# Patient Record
Sex: Female | Born: 1945 | Race: Black or African American | Hispanic: No | Marital: Married | Smoking: Never smoker
Health system: Southern US, Community
[De-identification: ages and names within clinical notes are randomized; demographics above are authoritative.]

## PROBLEM LIST (undated history)

## (undated) DIAGNOSIS — I10 Essential (primary) hypertension: Secondary | ICD-10-CM

## (undated) DIAGNOSIS — T4145XA Adverse effect of unspecified anesthetic, initial encounter: Secondary | ICD-10-CM

## (undated) DIAGNOSIS — R112 Nausea with vomiting, unspecified: Secondary | ICD-10-CM

## (undated) DIAGNOSIS — E785 Hyperlipidemia, unspecified: Secondary | ICD-10-CM

## (undated) DIAGNOSIS — M1990 Unspecified osteoarthritis, unspecified site: Secondary | ICD-10-CM

## (undated) DIAGNOSIS — T8859XA Other complications of anesthesia, initial encounter: Secondary | ICD-10-CM

## (undated) DIAGNOSIS — E119 Type 2 diabetes mellitus without complications: Secondary | ICD-10-CM

## (undated) DIAGNOSIS — Z9889 Other specified postprocedural states: Secondary | ICD-10-CM

## (undated) DIAGNOSIS — Z87442 Personal history of urinary calculi: Secondary | ICD-10-CM

## (undated) DIAGNOSIS — K219 Gastro-esophageal reflux disease without esophagitis: Secondary | ICD-10-CM

## (undated) DIAGNOSIS — E669 Obesity, unspecified: Secondary | ICD-10-CM

## (undated) HISTORY — PX: EYE SURGERY: SHX253

## (undated) HISTORY — PX: HEMORRHOID SURGERY: SHX153

## (undated) HISTORY — PX: ABDOMINAL HYSTERECTOMY: SHX81

## (undated) HISTORY — PX: COLONOSCOPY: SHX174

---

## 2004-03-12 ENCOUNTER — Ambulatory Visit: Payer: Self-pay | Admitting: Internal Medicine

## 2004-10-07 ENCOUNTER — Ambulatory Visit: Payer: Self-pay | Admitting: Unknown Physician Specialty

## 2004-11-30 ENCOUNTER — Other Ambulatory Visit: Payer: Self-pay

## 2004-11-30 ENCOUNTER — Emergency Department: Payer: Self-pay | Admitting: Unknown Physician Specialty

## 2005-03-27 ENCOUNTER — Ambulatory Visit: Payer: Self-pay | Admitting: Internal Medicine

## 2005-05-06 ENCOUNTER — Emergency Department: Payer: Self-pay | Admitting: Emergency Medicine

## 2005-05-13 ENCOUNTER — Ambulatory Visit: Payer: Self-pay | Admitting: Internal Medicine

## 2006-03-31 ENCOUNTER — Ambulatory Visit: Payer: Self-pay | Admitting: Internal Medicine

## 2006-08-20 ENCOUNTER — Ambulatory Visit: Payer: Self-pay

## 2007-05-12 ENCOUNTER — Ambulatory Visit: Payer: Self-pay | Admitting: Internal Medicine

## 2008-05-15 ENCOUNTER — Ambulatory Visit: Payer: Self-pay | Admitting: Internal Medicine

## 2009-05-24 ENCOUNTER — Ambulatory Visit: Payer: Self-pay | Admitting: Internal Medicine

## 2009-11-23 ENCOUNTER — Emergency Department: Payer: Self-pay | Admitting: Emergency Medicine

## 2009-12-19 ENCOUNTER — Ambulatory Visit: Payer: Self-pay | Admitting: Physician Assistant

## 2009-12-19 ENCOUNTER — Emergency Department: Payer: Self-pay | Admitting: Emergency Medicine

## 2009-12-22 ENCOUNTER — Emergency Department: Payer: Self-pay | Admitting: Emergency Medicine

## 2010-02-09 ENCOUNTER — Emergency Department: Payer: Self-pay | Admitting: Unknown Physician Specialty

## 2010-05-31 ENCOUNTER — Ambulatory Visit: Payer: Self-pay | Admitting: Internal Medicine

## 2011-01-22 ENCOUNTER — Emergency Department: Payer: Self-pay | Admitting: Internal Medicine

## 2011-06-24 ENCOUNTER — Ambulatory Visit: Payer: Self-pay | Admitting: Internal Medicine

## 2011-07-27 ENCOUNTER — Emergency Department: Payer: Self-pay | Admitting: *Deleted

## 2011-07-27 LAB — CBC
HCT: 43.3 % (ref 35.0–47.0)
HGB: 14.1 g/dL (ref 12.0–16.0)
MCH: 28.8 pg (ref 26.0–34.0)
MCHC: 32.5 g/dL (ref 32.0–36.0)
MCV: 89 fL (ref 80–100)
Platelet: 244 10*3/uL (ref 150–440)
RBC: 4.89 10*6/uL (ref 3.80–5.20)
WBC: 7.7 10*3/uL (ref 3.6–11.0)

## 2011-07-27 LAB — URINALYSIS, COMPLETE
Bilirubin,UR: NEGATIVE
Blood: NEGATIVE
Glucose,UR: >=500 mg/dL (ref 0–75)
Ph: 6 (ref 4.5–8.0)
Protein: NEGATIVE
RBC,UR: 12 /HPF (ref 0–5)
Specific Gravity: 1.02 (ref 1.003–1.030)
Squamous Epithelial: 7

## 2011-07-27 LAB — COMPREHENSIVE METABOLIC PANEL
Anion Gap: 8 (ref 7–16)
BUN: 13 mg/dL (ref 7–18)
Bilirubin,Total: 0.5 mg/dL (ref 0.2–1.0)
Co2: 27 mmol/L (ref 21–32)
Creatinine: 0.93 mg/dL (ref 0.60–1.30)
EGFR (African American): >60
EGFR (Non-African Amer.): >60
Osmolality: 287 (ref 275–301)
SGPT (ALT): 26 U/L
Sodium: 139 mmol/L (ref 136–145)
Total Protein: 8 g/dL (ref 6.4–8.2)

## 2011-08-30 ENCOUNTER — Emergency Department: Payer: Self-pay | Admitting: Emergency Medicine

## 2011-08-30 LAB — CBC
MCH: 28.5 pg (ref 26.0–34.0)
MCHC: 33 g/dL (ref 32.0–36.0)
Platelet: 242 10*3/uL (ref 150–440)
RBC: 4.62 10*6/uL (ref 3.80–5.20)
RDW: 13.7 % (ref 11.5–14.5)

## 2011-08-30 LAB — URINALYSIS, COMPLETE
Bacteria: NONE SEEN
Bilirubin,UR: NEGATIVE
Protein: NEGATIVE
RBC,UR: 64 /HPF (ref 0–5)
Specific Gravity: 1.023 (ref 1.003–1.030)
WBC UR: 20 /HPF (ref 0–5)

## 2012-07-09 ENCOUNTER — Ambulatory Visit: Payer: Self-pay | Admitting: Unknown Physician Specialty

## 2012-08-19 ENCOUNTER — Ambulatory Visit: Payer: Self-pay | Admitting: Internal Medicine

## 2013-08-22 ENCOUNTER — Ambulatory Visit: Payer: Self-pay | Admitting: Internal Medicine

## 2013-08-25 ENCOUNTER — Ambulatory Visit: Payer: Self-pay | Admitting: Internal Medicine

## 2014-02-28 ENCOUNTER — Ambulatory Visit: Payer: Self-pay | Admitting: Internal Medicine

## 2014-09-12 ENCOUNTER — Ambulatory Visit
Admit: 2014-09-12 | Disposition: A | Discharge status: Home / Self Care | Payer: Self-pay | Attending: Internal Medicine | Admitting: Internal Medicine

## 2015-04-05 IMAGING — MG MM MAMMO DIAGNOSTIC UNILATERAL*R*
4 series · 4 of 4 positions shown · non-contrast
Comparison: Multiple prior studies most recently 08/22/2013 and
08/25/2013

CLINICAL DATA: Six-month re-evaluation of right breast
calcifications

EXAM:
DIGITAL DIAGNOSTIC  RIGHT MAMMOGRAM WITH CAD

[R MLO]
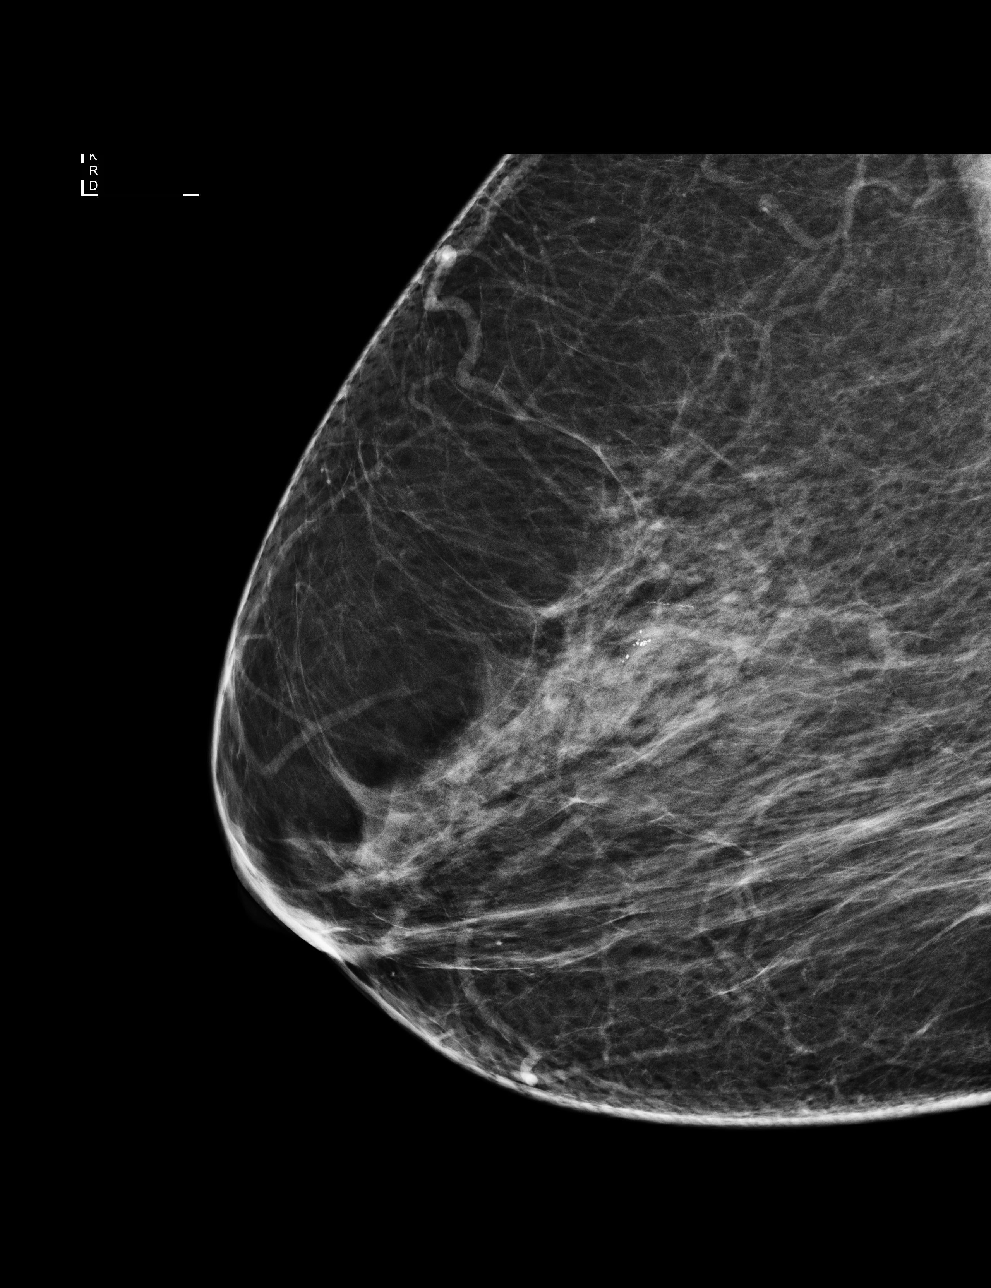

[R CC (1 of 2)]
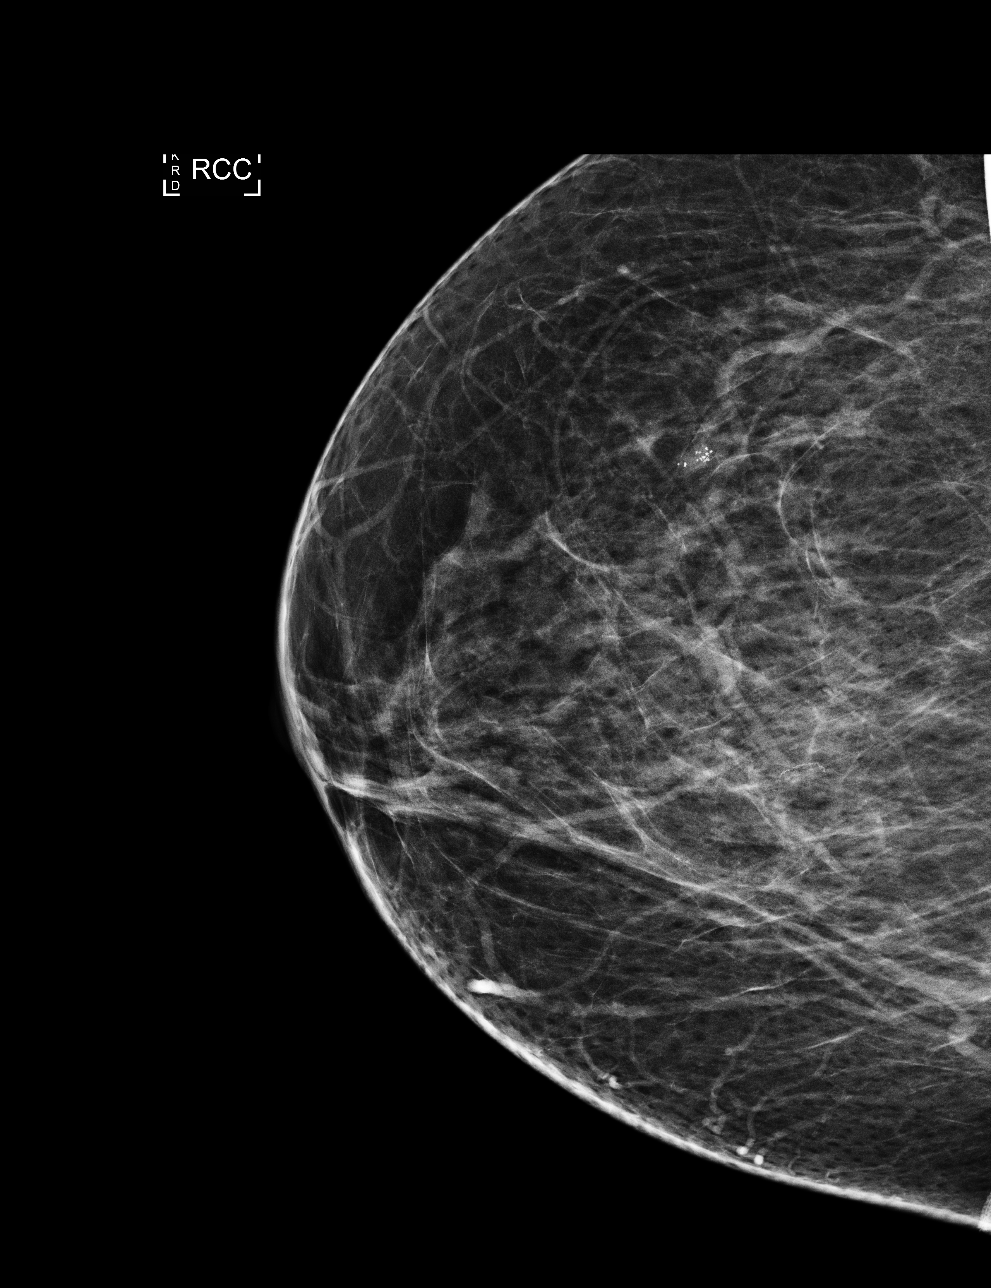

[R ML]
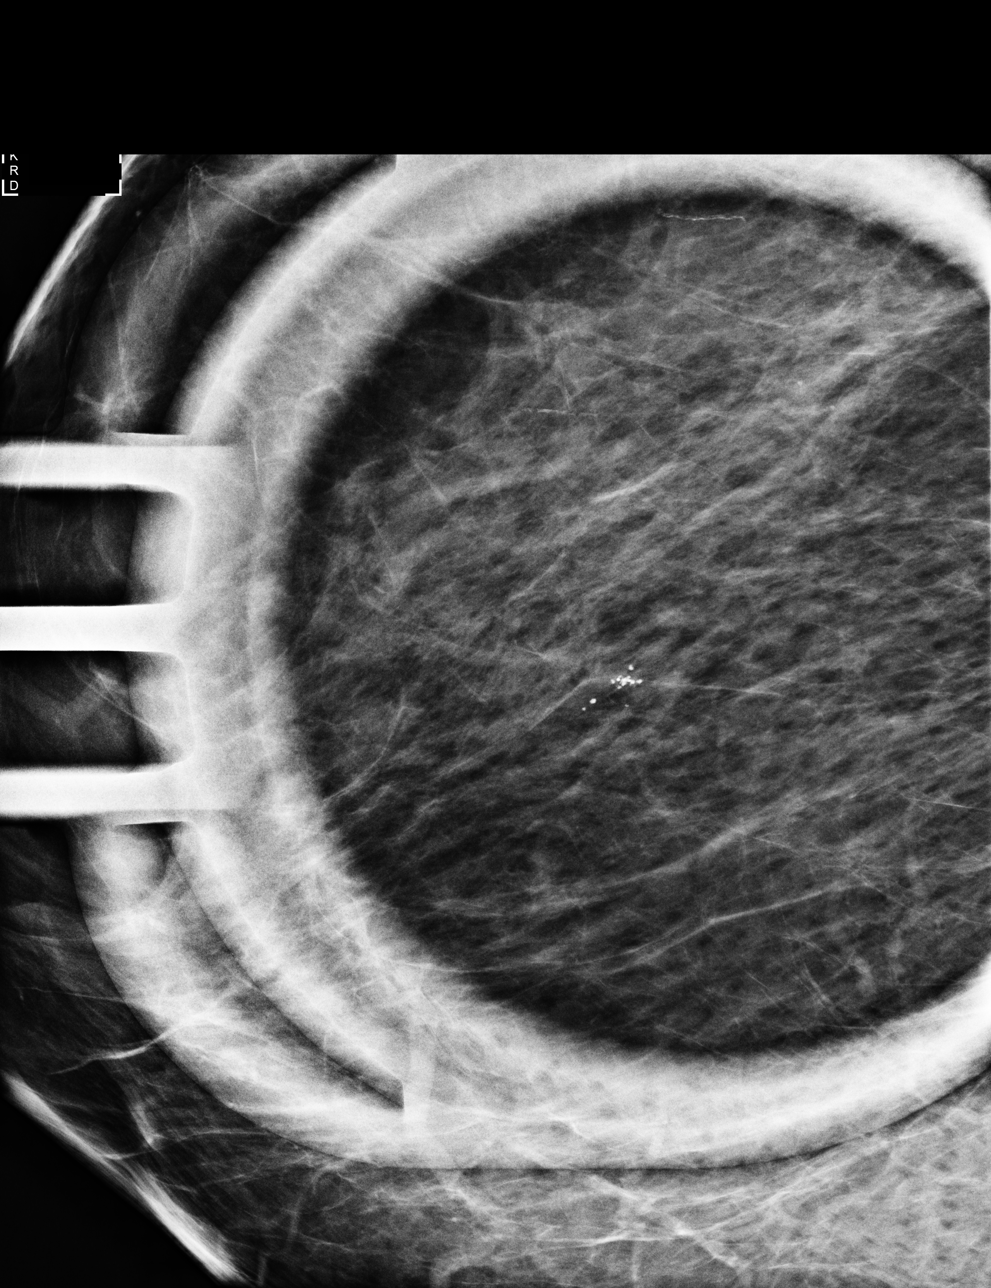

[R CC (2 of 2)]
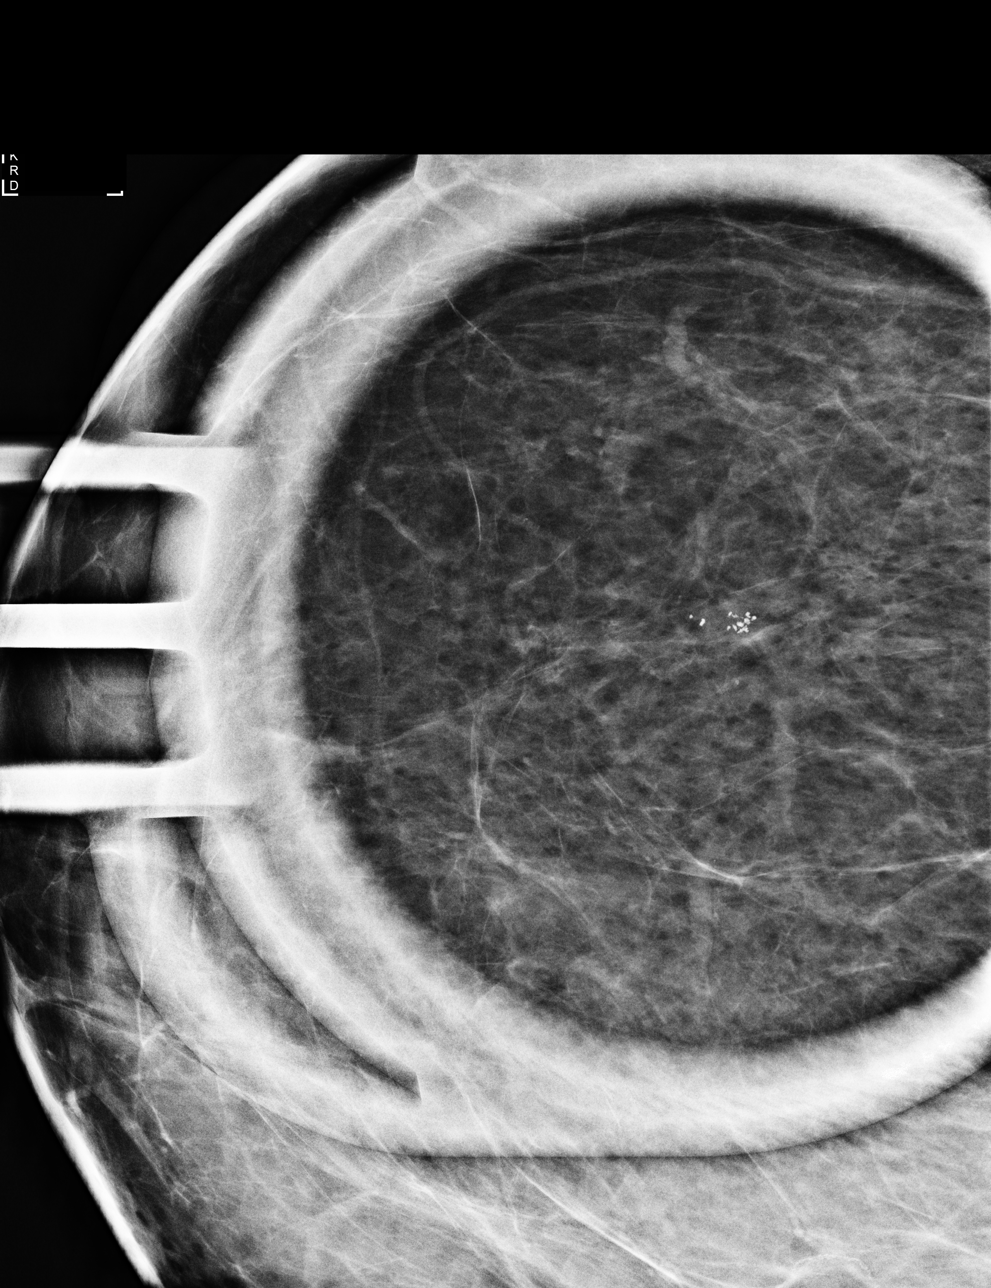

[4 of 4 positions shown; findings below may reference images not displayed]

ACR Breast Density Category c: The breast tissue is heterogeneously
dense, which may obscure small masses.
FINDINGS: Stable cluster of probably benign likely dystrophic calcifications
upper-outer quadrant right breast middle third depth. No other
significant findings or interval change on the right.

Mammographic images were processed with CAD.
IMPRESSION: Stable probably benign calcifications

RECOMMENDATION:
Diagnostic bilateral mammogram with magnified views on the right in
6 months

I have discussed the findings and recommendations with the patient.
Results were also provided in writing at the conclusion of the
visit. If applicable, a reminder letter will be sent to the patient
regarding the next appointment.

BI-RADS CATEGORY  Save

## 2015-05-03 ENCOUNTER — Other Ambulatory Visit: Payer: Self-pay | Admitting: Internal Medicine

## 2015-05-03 DIAGNOSIS — M1711 Unilateral primary osteoarthritis, right knee: Secondary | ICD-10-CM

## 2015-05-24 ENCOUNTER — Ambulatory Visit: Payer: Self-pay

## 2015-05-25 ENCOUNTER — Ambulatory Visit
Admission: RE | Admit: 2015-05-25 | Discharge: 2015-05-25 | Disposition: A | Discharge status: Home / Self Care | Payer: Medicare Other | Source: Ambulatory Visit | Attending: Internal Medicine | Admitting: Internal Medicine

## 2015-05-25 DIAGNOSIS — M25861 Other specified joint disorders, right knee: Secondary | ICD-10-CM | POA: Insufficient documentation

## 2015-05-25 DIAGNOSIS — M1711 Unilateral primary osteoarthritis, right knee: Secondary | ICD-10-CM | POA: Insufficient documentation

## 2015-05-25 DIAGNOSIS — X58XXXA Exposure to other specified factors, initial encounter: Secondary | ICD-10-CM | POA: Diagnosis not present

## 2015-05-25 DIAGNOSIS — R938 Abnormal findings on diagnostic imaging of other specified body structures: Secondary | ICD-10-CM | POA: Insufficient documentation

## 2015-05-25 DIAGNOSIS — S83231A Complex tear of medial meniscus, current injury, right knee, initial encounter: Secondary | ICD-10-CM | POA: Insufficient documentation

## 2015-05-25 DIAGNOSIS — S83281A Other tear of lateral meniscus, current injury, right knee, initial encounter: Secondary | ICD-10-CM | POA: Diagnosis not present

## 2015-06-14 ENCOUNTER — Inpatient Hospital Stay: Admission: RE | Admit: 2015-06-14 | Payer: Medicare Other | Source: Ambulatory Visit

## 2015-06-14 ENCOUNTER — Encounter: Payer: Self-pay | Admitting: *Deleted

## 2015-06-14 NOTE — Patient Instructions (Signed)
  Your procedure is scheduled on: 06/25/15 Report to Day Surgery.[facility] SECOND FLOOR To find out your arrival time please call [phone] between 1PM - 3PM on 06/22/15  Remember: Instructions that are not followed completely may result in serious medical risk, up to and including death, or upon the discretion of your surgeon and anesthesiologist your surgery may need to be rescheduled.    _X___ 1. Do not eat food or drink liquids after midnight. No gum chewing or hard candies.     __X__ 2. No Alcohol for 24 hours before or after surgery.   ____ 3. Bring all medications with you on the day of surgery if instructed.    _X___ 4. Notify your doctor if there is any change in your medical condition     (cold, fever, infections).     Do not wear jewelry, make-up, hairpins, clips or nail polish.  Do not wear lotions, powders, or perfumes. You may wear deodorant.  Do not shave 48 hours prior to surgery. Men may shave face and neck.  Do not bring valuables to the hospital.    [facility] is not responsible for any belongings or valuables.               Contacts, dentures or bridgework may not be worn into surgery.  Leave your suitcase in the car. After surgery it may be brought to your room.  For patients admitted to the hospital, discharge time is determined by your                treatment team.   Patients discharged the day of surgery will not be allowed to drive home.   Please read over the following fact sheets that you were given:   Surgical Site Infection Prevention   ____ Take these medicines the morning of surgery with A SIP OF WATER:    1. NONE  2.   3.   4.  5.  6.  ____ Fleet Enema (as directed)   __X__ Use CHG Soap as directed  ____ Use inhalers on the day of surgery  _X___ Stop metformin 2 days prior to surgery    ____ Take 1/2 of usual insulin dose the night before surgery and none on the morning of surgery.   _X___ Stop Coumadin/Plavix/aspirin on  STOP  ASPIRIN 1 WEEK BEFORE SURGERY  _X___ Stop Anti-inflammatories on    STOP ADVIL 1 WEEK BEFORE SURGERY    MAY USE TYLENOL IF NEEDED   ____ Stop supplements until after surgery.    ____ Bring C-Pap to the hospital.

## 2015-06-14 NOTE — Pre-Procedure Instructions (Addendum)
Component Name  04/26/2015 10/31/2014 04/19/2014 11/01/2013    4.9 5.4 5.5 6.2  4.81 4.89 4.67 4.90  14.0 14.7 14.1 14.7  43.4 44.3 42.6 43.7  90.2 90.6 91.2 89.2  29.1 30.1 30.2 30  32.3 33.2 33.1 33.6  12.6 13.3 12.8 13.1  9.2 9.8 9.8 9.7  0.00      WBC (White Blood Cell Count)  RBC (Red Blood Cell Count)  Hemoglobin  Hematocrit  MCV (Mean Corpuscular Volume)  MCH (Mean Corpuscular Hemoglobin)  MCHC (Mean Corpuscular Hemoglobin Concentration)  RDW-CV (Red Cell Distribution Width)  MPV (Mean Platelet Volume)  Immature Granulocyte Count   PROFILE  Component Name  04/26/2015 10/31/2014 04/19/2014 11/01/2013    132 (H) 84 130 (H) 110  139 140 138 138  4.1 4.2 4 4.1  105 105 104 102  26.9 31.5 29.7 32.1 (H)  9.4 9.7 9.4 9.'7  14 14 10 13  1 1 ' 0.9 0.9  66 66 75 75     14.4     8.0  6.8 6.9 6.6 6.7  3.7 3.8 3.6 3.8  0.6 0.6 0.7 0.6     0.1  53 60 56 55  '14 15 13 14  10 11 9 12  ' 1.2 1.2 1.2    Glucose  Sodium  Potassium  Chloride  Carbon Dioxide (CO2)  Calcium  Urea Nitrogen (BUN)  Creatinine  Glomerular Filtration Rate (eGFR), MDRD Estimate  BUN/Crea Ratio  Anion Gap w/K  Protein, Total  Albumin  Bilirubin, Total  Bilirubin, Conjugated  Alk Phos (alkaline Phosphatase)  AST   ALT   A/G Ratio

## 2015-06-18 ENCOUNTER — Encounter
Admission: RE | Admit: 2015-06-18 | Discharge: 2015-06-18 | Disposition: A | Discharge status: Home / Self Care | Payer: Medicare Other | Source: Ambulatory Visit | Attending: Orthopedic Surgery | Admitting: Orthopedic Surgery

## 2015-06-18 DIAGNOSIS — Z01810 Encounter for preprocedural cardiovascular examination: Secondary | ICD-10-CM | POA: Insufficient documentation

## 2015-06-25 ENCOUNTER — Encounter
Admission: RE | Disposition: A | Discharge status: Home / Self Care | Payer: Self-pay | Source: Ambulatory Visit | Attending: Orthopedic Surgery

## 2015-06-25 ENCOUNTER — Ambulatory Visit: Payer: Medicare Other | Admitting: Certified Registered"

## 2015-06-25 ENCOUNTER — Ambulatory Visit
Admission: RE | Admit: 2015-06-25 | Discharge: 2015-06-25 | Disposition: A | Discharge status: Home / Self Care | Payer: Medicare Other | Source: Ambulatory Visit | Attending: Orthopedic Surgery | Admitting: Orthopedic Surgery

## 2015-06-25 DIAGNOSIS — M23251 Derangement of posterior horn of lateral meniscus due to old tear or injury, right knee: Secondary | ICD-10-CM | POA: Insufficient documentation

## 2015-06-25 DIAGNOSIS — E119 Type 2 diabetes mellitus without complications: Secondary | ICD-10-CM | POA: Insufficient documentation

## 2015-06-25 DIAGNOSIS — Z7984 Long term (current) use of oral hypoglycemic drugs: Secondary | ICD-10-CM | POA: Diagnosis not present

## 2015-06-25 DIAGNOSIS — Z886 Allergy status to analgesic agent status: Secondary | ICD-10-CM | POA: Diagnosis not present

## 2015-06-25 DIAGNOSIS — K219 Gastro-esophageal reflux disease without esophagitis: Secondary | ICD-10-CM | POA: Diagnosis not present

## 2015-06-25 DIAGNOSIS — M25561 Pain in right knee: Secondary | ICD-10-CM | POA: Insufficient documentation

## 2015-06-25 DIAGNOSIS — Z7982 Long term (current) use of aspirin: Secondary | ICD-10-CM | POA: Insufficient documentation

## 2015-06-25 DIAGNOSIS — I10 Essential (primary) hypertension: Secondary | ICD-10-CM | POA: Insufficient documentation

## 2015-06-25 DIAGNOSIS — Z90710 Acquired absence of both cervix and uterus: Secondary | ICD-10-CM | POA: Diagnosis not present

## 2015-06-25 DIAGNOSIS — M2390 Unspecified internal derangement of unspecified knee: Secondary | ICD-10-CM | POA: Diagnosis present

## 2015-06-25 DIAGNOSIS — E785 Hyperlipidemia, unspecified: Secondary | ICD-10-CM | POA: Insufficient documentation

## 2015-06-25 DIAGNOSIS — M94261 Chondromalacia, right knee: Secondary | ICD-10-CM | POA: Diagnosis not present

## 2015-06-25 DIAGNOSIS — E669 Obesity, unspecified: Secondary | ICD-10-CM | POA: Diagnosis not present

## 2015-06-25 DIAGNOSIS — M1990 Unspecified osteoarthritis, unspecified site: Secondary | ICD-10-CM | POA: Insufficient documentation

## 2015-06-25 DIAGNOSIS — Z6831 Body mass index (BMI) 31.0-31.9, adult: Secondary | ICD-10-CM | POA: Diagnosis not present

## 2015-06-25 DIAGNOSIS — Z86010 Personal history of colonic polyps: Secondary | ICD-10-CM | POA: Insufficient documentation

## 2015-06-25 DIAGNOSIS — Z79899 Other long term (current) drug therapy: Secondary | ICD-10-CM | POA: Insufficient documentation

## 2015-06-25 DIAGNOSIS — M23231 Derangement of other medial meniscus due to old tear or injury, right knee: Secondary | ICD-10-CM | POA: Diagnosis not present

## 2015-06-25 DIAGNOSIS — Z833 Family history of diabetes mellitus: Secondary | ICD-10-CM | POA: Insufficient documentation

## 2015-06-25 HISTORY — PX: KNEE ARTHROSCOPY: SHX127

## 2015-06-25 HISTORY — DX: Hyperlipidemia, unspecified: E78.5

## 2015-06-25 HISTORY — DX: Essential (primary) hypertension: I10

## 2015-06-25 HISTORY — DX: Obesity, unspecified: E66.9

## 2015-06-25 HISTORY — DX: Type 2 diabetes mellitus without complications: E11.9

## 2015-06-25 HISTORY — DX: Gastro-esophageal reflux disease without esophagitis: K21.9

## 2015-06-25 HISTORY — DX: Unspecified osteoarthritis, unspecified site: M19.90

## 2015-06-25 LAB — GLUCOSE, CAPILLARY
Glucose-Capillary: 88 mg/dL (ref 65–99)
Glucose-Capillary: 78 mg/dL (ref 65–99)

## 2015-06-25 SURGERY — ARTHROSCOPY, KNEE
Anesthesia: General | Site: Knee | Laterality: Right | Wound class: Clean

## 2015-06-25 MED ORDER — ONDANSETRON HCL 4 MG PO TABS: 4.0000 mg | ORAL_TABLET | Freq: Four times a day (QID) | ORAL | Status: DC | PRN

## 2015-06-25 MED ORDER — ONDANSETRON HCL 4 MG PO TABS
ORAL_TABLET | ORAL | Status: AC
  Filled 2015-06-25: qty 1

## 2015-06-25 MED ORDER — SODIUM CHLORIDE 0.9 % IV SOLN
INTRAVENOUS | Status: DC
  Administered 2015-06-25: 15:00:00 via INTRAVENOUS

## 2015-06-25 MED ORDER — BUPIVACAINE HCL 0.25 % IJ SOLN
INTRAMUSCULAR | Status: DC | PRN
  Administered 2015-06-25: 5 mL
  Administered 2015-06-25: 25 mL

## 2015-06-25 MED ORDER — FENTANYL CITRATE (PF) 100 MCG/2ML IJ SOLN
INTRAMUSCULAR | Status: AC
  Filled 2015-06-25: qty 2

## 2015-06-25 MED ORDER — LABETALOL HCL 5 MG/ML IV SOLN
INTRAVENOUS | Status: AC
  Filled 2015-06-25: qty 4

## 2015-06-25 MED ORDER — ONDANSETRON HCL 4 MG/2ML IJ SOLN
INTRAMUSCULAR | Status: DC | PRN
  Administered 2015-06-25: 4 mg via INTRAVENOUS

## 2015-06-25 MED ORDER — GLYCOPYRROLATE 0.2 MG/ML IJ SOLN
INTRAMUSCULAR | Status: DC | PRN
  Administered 2015-06-25: 0.2 mg via INTRAVENOUS

## 2015-06-25 MED ORDER — METOCLOPRAMIDE HCL 10 MG PO TABS: 5.0000 mg | ORAL_TABLET | Freq: Three times a day (TID) | ORAL | Status: DC | PRN

## 2015-06-25 MED ORDER — LABETALOL HCL 5 MG/ML IV SOLN
5.0000 mg | Freq: Once | INTRAVENOUS | Status: AC
  Administered 2015-06-25: 5 mg via INTRAVENOUS

## 2015-06-25 MED ORDER — ACETAMINOPHEN 10 MG/ML IV SOLN
INTRAVENOUS | Status: DC | PRN
  Administered 2015-06-25: 1000 mg via INTRAVENOUS

## 2015-06-25 MED ORDER — MORPHINE SULFATE (PF) 4 MG/ML IV SOLN
INTRAVENOUS | Status: AC
  Filled 2015-06-25: qty 1

## 2015-06-25 MED ORDER — PROPOFOL 10 MG/ML IV BOLUS
INTRAVENOUS | Status: DC | PRN
  Administered 2015-06-25: 150 mg via INTRAVENOUS

## 2015-06-25 MED ORDER — PROMETHAZINE HCL 25 MG/ML IJ SOLN
INTRAMUSCULAR | Status: AC
  Administered 2015-06-25: 12.5 mg via INTRAMUSCULAR
  Filled 2015-06-25: qty 1

## 2015-06-25 MED ORDER — HYDROCODONE-ACETAMINOPHEN 5-325 MG PO TABS: 1.0000 | ORAL_TABLET | ORAL | Status: DC | PRN

## 2015-06-25 MED ORDER — ONDANSETRON HCL 4 MG/2ML IJ SOLN: 4.0000 mg | Freq: Once | INTRAMUSCULAR | Status: DC | PRN

## 2015-06-25 MED ORDER — SODIUM CHLORIDE 0.9 % IV SOLN: INTRAVENOUS | Status: DC

## 2015-06-25 MED ORDER — FAMOTIDINE 20 MG PO TABS
ORAL_TABLET | ORAL | Status: AC
  Filled 2015-06-25: qty 1

## 2015-06-25 MED ORDER — LIDOCAINE HCL (PF) 2 % IJ SOLN
INTRAMUSCULAR | Status: DC | PRN
  Administered 2015-06-25: 50 mg

## 2015-06-25 MED ORDER — METOCLOPRAMIDE HCL 5 MG/ML IJ SOLN: 5.0000 mg | Freq: Three times a day (TID) | INTRAMUSCULAR | Status: DC | PRN

## 2015-06-25 MED ORDER — FENTANYL CITRATE (PF) 100 MCG/2ML IJ SOLN
25.0000 ug | INTRAMUSCULAR | Status: DC | PRN
  Administered 2015-06-25 (×4): 25 ug via INTRAVENOUS

## 2015-06-25 MED ORDER — KETAMINE HCL 10 MG/ML IJ SOLN
INTRAMUSCULAR | Status: DC | PRN
  Administered 2015-06-25: 20 mg via INTRAVENOUS

## 2015-06-25 MED ORDER — ONDANSETRON HCL 4 MG/2ML IJ SOLN: 4.0000 mg | Freq: Four times a day (QID) | INTRAMUSCULAR | Status: DC | PRN

## 2015-06-25 MED ORDER — MORPHINE SULFATE (PF) 4 MG/ML IV SOLN
INTRAVENOUS | Status: DC | PRN
  Administered 2015-06-25: 4 mg

## 2015-06-25 MED ORDER — FAMOTIDINE 20 MG PO TABS
20.0000 mg | ORAL_TABLET | Freq: Once | ORAL | Status: AC
  Administered 2015-06-25: 20 mg via ORAL

## 2015-06-25 MED ORDER — PROMETHAZINE HCL 25 MG/ML IJ SOLN
12.5000 mg | Freq: Once | INTRAMUSCULAR | Status: AC
Start: 2015-06-25 — End: 2015-06-25
  Administered 2015-06-25: 12.5 mg via INTRAMUSCULAR

## 2015-06-25 MED ORDER — ACETAMINOPHEN 10 MG/ML IV SOLN
INTRAVENOUS | Status: AC
  Filled 2015-06-25: qty 100

## 2015-06-25 MED ORDER — BUPIVACAINE-EPINEPHRINE (PF) 0.25% -1:200000 IJ SOLN
INTRAMUSCULAR | Status: AC
  Filled 2015-06-25: qty 30

## 2015-06-25 MED ORDER — FENTANYL CITRATE (PF) 100 MCG/2ML IJ SOLN
INTRAMUSCULAR | Status: DC | PRN
  Administered 2015-06-25 (×4): 50 ug via INTRAVENOUS

## 2015-06-25 SURGICAL SUPPLY — 23 items
BLADE SHAVER 4.5 DBL SERAT CV (CUTTER) ×3 IMPLANT
BNDG ESMARK 6X12 TAN STRL LF (GAUZE/BANDAGES/DRESSINGS) ×3 IMPLANT
DRSG DERMACEA 8X12 NADH (GAUZE/BANDAGES/DRESSINGS) ×3 IMPLANT
DURAPREP 26ML APPLICATOR (WOUND CARE) ×6 IMPLANT
GAUZE SPONGE 4X4 12PLY STRL (GAUZE/BANDAGES/DRESSINGS) ×3 IMPLANT
GLOVE BIOGEL M STRL SZ7.5 (GLOVE) ×3 IMPLANT
GLOVE INDICATOR 8.0 STRL GRN (GLOVE) ×3 IMPLANT
GOWN STRL REUS W/ TWL LRG LVL3 (GOWN DISPOSABLE) ×1 IMPLANT
GOWN STRL REUS W/ TWL LRG LVL4 (GOWN DISPOSABLE) ×1 IMPLANT
GOWN STRL REUS W/TWL LRG LVL3 (GOWN DISPOSABLE) ×2
GOWN STRL REUS W/TWL LRG LVL4 (GOWN DISPOSABLE) ×2
IV LACTATED RINGER IRRG 3000ML (IV SOLUTION) ×12
IV LR IRRIG 3000ML ARTHROMATIC (IV SOLUTION) ×6 IMPLANT
MANIFOLD NEPTUNE II (INSTRUMENTS) ×3 IMPLANT
PACK ARTHROSCOPY KNEE (MISCELLANEOUS) ×3 IMPLANT
SET TUBE SUCT SHAVER OUTFL 24K (TUBING) ×3 IMPLANT
SET TUBE TIP INTRA-ARTICULAR (MISCELLANEOUS) ×3 IMPLANT
STRAP SAFETY BODY (MISCELLANEOUS) ×3 IMPLANT
SUT ETHILON 3-0 FS-10 30 BLK (SUTURE) ×3
SUTURE EHLN 3-0 FS-10 30 BLK (SUTURE) ×1 IMPLANT
TUBING ARTHRO INFLOW-ONLY STRL (TUBING) ×3 IMPLANT
WAND HAND CNTRL MULTIVAC 50 (MISCELLANEOUS) ×3 IMPLANT
WRAP KNEE W/COLD PACKS 25.5X14 (SOFTGOODS) ×3 IMPLANT

## 2015-06-25 NOTE — OR Nursing (Signed)
Dr Ernest Pine reviewed ua from 04/25/2016

## 2015-06-25 NOTE — OR Nursing (Signed)
Pt decided she is ready for discharge.

## 2015-06-25 NOTE — OR Nursing (Signed)
Pt continues to be nauseated and has vomited twice- Dr Maisie Fus notified pt unable to take po zofran order received for Phenergan.

## 2015-06-25 NOTE — OR Nursing (Signed)
Dr Maisie Fus advised pt to make an appt with primary care doctor  To evaluate blood pressure.

## 2015-06-25 NOTE — Anesthesia Procedure Notes (Signed)
Procedure Name: LMA Insertion Performed by: Piedad Standiford Pre-anesthesia Checklist: Patient identified, Patient being monitored, Timeout performed, Emergency Drugs available and Suction available Patient Re-evaluated:Patient Re-evaluated prior to inductionOxygen Delivery Method: Circle system utilized Preoxygenation: Pre-oxygenation with 100% oxygen Intubation Type: IV induction Ventilation: Mask ventilation without difficulty LMA: LMA inserted LMA Size: 3.0 Tube type: Oral Number of attempts: 1 Placement Confirmation: positive ETCO2 and breath sounds checked- equal and bilateral Tube secured with: Tape Dental Injury: Teeth and Oropharynx as per pre-operative assessment      

## 2015-06-25 NOTE — Brief Op Note (Signed)
06/25/2015  5:30 PM  PATIENT:  Courtney [name]  70 y.o. 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, right knee  PROCEDURE:  Procedure(s): ARTHROSCOPY KNEE, PARTIAL MEDIAL MENISECTOMY, PARTIAL LATERAL MENISECTOMY, CHONDROPLASTY MEDIAL PATELLAR FEMORAL (Right)  SURGEON:  Surgeon(s) and Role:    * Donato Heinz, MD - Primary  ASSISTANTS: none   ANESTHESIA:   general  EBL:  Total I/O In: 600 [I.V.:600] Out: -   BLOOD ADMINISTERED:none  DRAINS: none   LOCAL MEDICATIONS USED:  MARCAINE     SPECIMEN:  No Specimen  DISPOSITION OF SPECIMEN:  N/A  COUNTS:  YES  TOURNIQUET:   not used  DICTATION: .Office manager  PLAN OF CARE: Discharge to home after PACU  PATIENT DISPOSITION:  PACU - hemodynamically stable.   Delay start of Pharmacological VTE agent (>24hrs) due to surgical blood loss or risk of bleeding: not applicable

## 2015-06-25 NOTE — Anesthesia Preprocedure Evaluation (Addendum)
Anesthesia Evaluation  Patient identified by MRN, date of birth, ID band Patient awake    Reviewed: Allergy & Precautions, NPO status , Patient's Chart, lab work & pertinent test results, reviewed documented beta blocker date and time   Airway Mallampati: II  TM Distance: >3 FB     Dental  (+) Chipped   Pulmonary           Cardiovascular hypertension,      Neuro/Psych    GI/Hepatic GERD  Controlled,  Endo/Other  diabetes, Type 2  Renal/GU      Musculoskeletal  (+) Arthritis ,   Abdominal   Peds  Hematology   Anesthesia Other Findings   Reproductive/Obstetrics                           Anesthesia Physical Anesthesia Plan  ASA: III  Anesthesia Plan: General   Post-op Pain Management:    Induction: Intravenous  Airway Management Planned: LMA  Additional Equipment:   Intra-op Plan:   Post-operative Plan:   Informed Consent: I have reviewed the patients History and Physical, chart, labs and discussed the procedure including the risks, benefits and alternatives for the proposed anesthesia with the patient or authorized representative who has indicated his/her understanding and acceptance.     Plan Discussed with: CRNA  Anesthesia Plan Comments:        Anesthesia Quick Evaluation

## 2015-06-25 NOTE — Op Note (Signed)
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   ASSISTANT: none  ANESTHESIA: general  ESTIMATED BLOOD LOSS: Minimal  FLUIDS REPLACED: 600 mL of crystalloid  TOURNIQUET TIME: Not used   DRAINS: none  IMPLANTS UTILIZED: None  INDICATIONS FOR SURGERY: Courtney [name] is a 70 y.o. year old female who has been seen for complaints of right knee pain. MRI demonstrated findings consistent with meniscal pathology. After discussion of the risks and benefits of surgical intervention, the patient expressed understanding of the risks benefits and agree with plans for right knee arthroscopy.   PROCEDURE IN DETAIL: The patient was brought into the operating room and, after adequate general anesthesia was achieved, a tourniquet was applied to the right thigh and the leg was placed in the leg holder. All bony prominences were well padded. The patient's right knee was cleaned and prepped with alcohol and Duraprep and draped in the usual sterile fashion. A "timeout" was performed as per usual protocol. The anticipated portal sites were injected with 0.25% Marcaine with epinephrine. An anterolateral incision was made and a cannula was inserted. A large effusion was evacuated and the knee was distended with fluid using the pump. The scope was advanced down the medial gutter into the medial compartment. Under visualization with the scope, an anteromedial portal was created and a hooked probe was inserted.  The medial meniscus was visualized and probed. There was a complex degenerative tear of the posterior [place] the medial meniscus. The tear was debrided using a combination of meniscal punches and the 4.5 mm incisor shaver. Final contouring was performed using the 50 ArthroCare wand. The remaining rim of meniscus was visualized and probed and felt to be stable. The articular cartilage was visualized. There were grade 3 changes of chondromalacia involving focal area of the medial femoral condyle. These areas were debrided and contoured using the ArthroCare wand.  The scope was then advanced into the intercondylar notch. The anterior cruciate ligament was visualized and probed and felt to be intact. The scope was removed from the lateral portal and reinserted via the anteromedial portal to better visualize the lateral compartment. The lateral meniscus was visualized and probed. There were several radial tears along the posterior horn of the lateral meniscus. The tears were debrided using a meniscal punches and the 4.5 mm incisor shaver. Final contouring was performed using the ArthroCare wand. The articular cartilage of the lateral compartment was visualized. The articular surface was in good condition. Finally, the scope was advanced so as to visualize the patellofemoral articulation. Good patellar tracking was appreciated. There were grade 2-3 changes of chondromalacia involving the patellofemoral articulation. This area was debrided using ArthroCare wand.  The knee was irrigated with copius amounts of fluid and suctioned dry. The anterolateral portal was re-approximated with #3-0 nylon. A combination of 0.25% Marcaine with epinephrine and 4 mg of Morphine were injected via the scope. The scope was removed and the anteromedial portal was re-approximated with #3-0 nylon. A sterile dressing was applied followed by  application of an ice wrap.  The patient tolerated the procedure well and was transported to the  PACU in stable condition.  James P. Angie Fava., M.D.

## 2015-06-25 NOTE — Transfer of Care (Signed)
Immediate Anesthesia Transfer of Care Note  Patient: Courtney [name]  Procedure(s) Performed: Procedure(s): ARTHROSCOPY KNEE, PARTIAL MEDIAL MENISECTOMY, PARTIAL LATERAL MENISECTOMY, CHONDROPLASTY MEDIAL PATELLAR FEMORAL (Right)  Patient Location: PACU  Anesthesia Type:General  Level of Consciousness: awake  Airway & Oxygen Therapy: Patient Spontanous Breathing and Patient connected to face mask oxygen  Post-op Assessment: Report given to RN  Post vital signs: Reviewed  Last Vitals:  Filed Vitals:   06/25/15 1730 06/25/15 1731  BP: 197/89 197/89  Pulse: 104 101  Temp: 36.4 C 36.4 C  Resp: 15 14    Complications: No apparent anesthesia complications

## 2015-06-25 NOTE — OR Nursing (Signed)
Instructed incentive spirometry.

## 2015-06-25 NOTE — H&P (Signed)
The patient has been re-examined, and the chart reviewed, and there have been no interval changes to the documented history and physical.    The risks, benefits, and alternatives have been discussed at length. The patient expressed understanding of the risks benefits and agreed with plans for surgical intervention.  James P. Hooten, Jr. M.D.    

## 2015-06-25 NOTE — Discharge Instructions (Signed)
°  Instructions after Knee Arthroscopy  ° ° Aarish Rockers P. Aland Chestnutt, Jr., M.D.    ° Dept. of Orthopaedics & Sports Medicine ° [facility] ° [address] ° [place], [place]  [address] ° ° Phone: [phone]   Fax: [phone] ° ° °DIET: °• Drink plenty of non-alcoholic fluids & begin a light diet. °• Resume your normal diet the day after surgery. ° °ACTIVITY:  °• You may use crutches or a walker with weight-bearing as tolerated, unless instructed otherwise. °• You may wean yourself off of the walker or crutches as tolerated.  °• Begin doing gentle exercises. Exercising will reduce the pain and swelling, increase motion, and prevent muscle weakness.   °• Avoid strenuous activities or athletics for a minimum of 4-6 weeks after arthroscopic surgery. °• Do not drive or operate any equipment until instructed. ° °WOUND CARE:  °• Place one to two pillows under the knee the first day or two when sitting or lying.  °• Continue to use the ice packs periodically to reduce pain and swelling. °• The small incisions in your knee are closed with nylon stitches. The stitches will be removed in the office. °• The bulky dressing may be removed on the second day after surgery. DO NOT TOUCH THE STITCHES. Put a Band-Aid over each stitch. Do NOT use any ointments or creams on the incisions.  °• You may bathe or shower after the stitches are removed at the first office visit following surgery. ° °MEDICATIONS: °• You may resume your regular medications. °• Please take the pain medication as prescribed. °• Do not take pain medication on an empty stomach. °• Do not drive or drink alcoholic beverages when taking pain medications. ° °CALL THE OFFICE FOR: °• Temperature above 101 degrees °• Excessive bleeding or drainage on the dressing. °• Excessive swelling, coldness, or paleness of the toes. °• Persistent nausea and vomiting. ° °FOLLOW-UP:  °• You should have an appointment to return to the office in 7-10 days after surgery.  °  °

## 2015-06-26 ENCOUNTER — Encounter: Payer: Self-pay | Admitting: Orthopedic Surgery

## 2015-06-26 NOTE — Anesthesia Postprocedure Evaluation (Signed)
Anesthesia Post Note  Patient: Courtney [name]  Procedure(s) Performed: Procedure(s) (LRB): ARTHROSCOPY KNEE, PARTIAL MEDIAL MENISECTOMY, PARTIAL LATERAL MENISECTOMY, CHONDROPLASTY MEDIAL PATELLAR FEMORAL (Right)  Patient location during evaluation: PACU Anesthesia Type: General Level of consciousness: awake and alert Pain management: pain level controlled Vital Signs Assessment: post-procedure vital signs reviewed and stable Respiratory status: spontaneous breathing, nonlabored ventilation, respiratory function stable and patient connected to nasal cannula oxygen Cardiovascular status: blood pressure returned to baseline and stable Postop Assessment: no signs of nausea or vomiting Anesthetic complications: no    Last Vitals:  Filed Vitals:   06/25/15 1918 06/25/15 1932  BP: 156/70 169/85  Pulse: 73   Temp:    Resp: 16     Last Pain:  Filed Vitals:   06/26/15 1313  PainSc: 6                  Niyati Heinke S

## 2016-05-19 HISTORY — PX: CATARACT EXTRACTION W/ INTRAOCULAR LENS IMPLANT: SHX1309

## 2017-02-12 ENCOUNTER — Other Ambulatory Visit: Payer: Self-pay | Admitting: Internal Medicine

## 2017-02-12 DIAGNOSIS — R921 Mammographic calcification found on diagnostic imaging of breast: Secondary | ICD-10-CM

## 2017-02-24 ENCOUNTER — Other Ambulatory Visit: Payer: Medicare Other

## 2017-03-26 ENCOUNTER — Ambulatory Visit
Admission: RE | Admit: 2017-03-26 | Discharge: 2017-03-26 | Disposition: A | Discharge status: Home / Self Care | Payer: Medicare Other | Source: Ambulatory Visit | Attending: Internal Medicine | Admitting: Internal Medicine

## 2017-03-26 DIAGNOSIS — R921 Mammographic calcification found on diagnostic imaging of breast: Secondary | ICD-10-CM

## 2018-01-08 ENCOUNTER — Emergency Department: Payer: Medicare Other

## 2018-01-08 ENCOUNTER — Emergency Department
Admission: EM | Admit: 2018-01-08 | Discharge: 2018-01-08 | Disposition: A | Discharge status: Home / Self Care | Payer: Medicare Other | Attending: Urology | Admitting: Urology

## 2018-01-08 ENCOUNTER — Emergency Department: Payer: Medicare Other | Admitting: Anesthesiology

## 2018-01-08 ENCOUNTER — Encounter
Admission: EM | Disposition: A | Discharge status: Home / Self Care | Payer: Self-pay | Source: Home / Self Care | Attending: Emergency Medicine

## 2018-01-08 ENCOUNTER — Telehealth: Payer: Self-pay | Admitting: Urology

## 2018-01-08 DIAGNOSIS — Z79899 Other long term (current) drug therapy: Secondary | ICD-10-CM | POA: Diagnosis not present

## 2018-01-08 DIAGNOSIS — K219 Gastro-esophageal reflux disease without esophagitis: Secondary | ICD-10-CM | POA: Diagnosis not present

## 2018-01-08 DIAGNOSIS — Z7982 Long term (current) use of aspirin: Secondary | ICD-10-CM | POA: Diagnosis not present

## 2018-01-08 DIAGNOSIS — N390 Urinary tract infection, site not specified: Secondary | ICD-10-CM | POA: Diagnosis present

## 2018-01-08 DIAGNOSIS — N132 Hydronephrosis with renal and ureteral calculous obstruction: Secondary | ICD-10-CM

## 2018-01-08 DIAGNOSIS — I10 Essential (primary) hypertension: Secondary | ICD-10-CM | POA: Insufficient documentation

## 2018-01-08 DIAGNOSIS — Z7984 Long term (current) use of oral hypoglycemic drugs: Secondary | ICD-10-CM | POA: Insufficient documentation

## 2018-01-08 DIAGNOSIS — M1990 Unspecified osteoarthritis, unspecified site: Secondary | ICD-10-CM | POA: Diagnosis not present

## 2018-01-08 DIAGNOSIS — Z6830 Body mass index (BMI) 30.0-30.9, adult: Secondary | ICD-10-CM | POA: Insufficient documentation

## 2018-01-08 DIAGNOSIS — R1031 Right lower quadrant pain: Secondary | ICD-10-CM

## 2018-01-08 DIAGNOSIS — E119 Type 2 diabetes mellitus without complications: Secondary | ICD-10-CM | POA: Insufficient documentation

## 2018-01-08 DIAGNOSIS — E669 Obesity, unspecified: Secondary | ICD-10-CM | POA: Diagnosis not present

## 2018-01-08 DIAGNOSIS — E785 Hyperlipidemia, unspecified: Secondary | ICD-10-CM | POA: Insufficient documentation

## 2018-01-08 HISTORY — PX: CYSTOSCOPY WITH STENT PLACEMENT: SHX5790

## 2018-01-08 LAB — URINALYSIS, COMPLETE (UACMP) WITH MICROSCOPIC
BILIRUBIN URINE: NEGATIVE
Glucose, UA: >=500 mg/dL — AB
Ketones, ur: NEGATIVE mg/dL
Nitrite: POSITIVE — AB
PH: 5 (ref 5.0–8.0)
Protein, ur: NEGATIVE mg/dL
Specific Gravity, Urine: 1.016 (ref 1.005–1.030)
WBC, UA: >50 WBC/hpf — ABNORMAL HIGH (ref 0–5)

## 2018-01-08 LAB — COMPREHENSIVE METABOLIC PANEL
ALT: 14 U/L (ref 0–44)
ANION GAP: 5 (ref 5–15)
AST: 19 U/L (ref 15–41)
Albumin: 3.7 g/dL (ref 3.5–5.0)
Alkaline Phosphatase: 78 U/L (ref 38–126)
BUN: 17 mg/dL (ref 8–23)
CALCIUM: 9.1 mg/dL (ref 8.9–10.3)
CHLORIDE: 107 mmol/L (ref 98–111)
CO2: 27 mmol/L (ref 22–32)
CREATININE: 1.04 mg/dL — AB (ref 0.44–1.00)
GFR, EST NON AFRICAN AMERICAN: 52 mL/min — AB (ref >60)
Glucose, Bld: 98 mg/dL (ref 70–99)
Potassium: 3.9 mmol/L (ref 3.5–5.1)
Sodium: 139 mmol/L (ref 135–145)
Total Bilirubin: 0.4 mg/dL (ref 0.3–1.2)
Total Protein: 7.4 g/dL (ref 6.5–8.1)

## 2018-01-08 LAB — CBC
HCT: 41.2 % (ref 35.0–47.0)
HEMOGLOBIN: 14 g/dL (ref 12.0–16.0)
MCH: 30.4 pg (ref 26.0–34.0)
MCHC: 33.9 g/dL (ref 32.0–36.0)
MCV: 89.5 fL (ref 80.0–100.0)
PLATELETS: 243 10*3/uL (ref 150–440)
RBC: 4.61 MIL/uL (ref 3.80–5.20)
RDW: 13.8 % (ref 11.5–14.5)
WBC: 6.7 10*3/uL (ref 3.6–11.0)

## 2018-01-08 LAB — GLUCOSE, CAPILLARY: Glucose-Capillary: 95 mg/dL (ref 70–99)

## 2018-01-08 LAB — LIPASE, BLOOD: Lipase: 149 U/L — ABNORMAL HIGH (ref 11–51)

## 2018-01-08 SURGERY — CYSTOSCOPY, WITH STENT INSERTION
Anesthesia: General | Laterality: Right

## 2018-01-08 MED ORDER — PHENYLEPHRINE HCL 10 MG/ML IJ SOLN
INTRAMUSCULAR | Status: DC | PRN
  Administered 2018-01-08 (×2): 100 ug via INTRAVENOUS

## 2018-01-08 MED ORDER — ONDANSETRON HCL 4 MG/2ML IJ SOLN: 4.0000 mg | Freq: Once | INTRAMUSCULAR | Status: DC | PRN

## 2018-01-08 MED ORDER — SULFAMETHOXAZOLE-TRIMETHOPRIM 800-160 MG PO TABS: 1.0000 | ORAL_TABLET | Freq: Two times a day (BID) | ORAL | 0 refills | Status: DC

## 2018-01-08 MED ORDER — IOPAMIDOL (ISOVUE-300) INJECTION 61%
100.0000 mL | Freq: Once | INTRAVENOUS | Status: AC | PRN
  Administered 2018-01-08: 100 mL via INTRAVENOUS

## 2018-01-08 MED ORDER — SUCCINYLCHOLINE CHLORIDE 20 MG/ML IJ SOLN
INTRAMUSCULAR | Status: DC | PRN
  Administered 2018-01-08: 100 mg via INTRAVENOUS

## 2018-01-08 MED ORDER — GLYCOPYRROLATE 0.2 MG/ML IJ SOLN
INTRAMUSCULAR | Status: DC | PRN
  Administered 2018-01-08: 0.2 mg via INTRAVENOUS

## 2018-01-08 MED ORDER — PHENAZOPYRIDINE HCL 200 MG PO TABS: 200.0000 mg | ORAL_TABLET | Freq: Three times a day (TID) | ORAL | 0 refills | Status: DC | PRN

## 2018-01-08 MED ORDER — ONDANSETRON HCL 4 MG/2ML IJ SOLN
4.0000 mg | Freq: Once | INTRAMUSCULAR | Status: AC
  Administered 2018-01-08: 4 mg via INTRAVENOUS
  Filled 2018-01-08: qty 2

## 2018-01-08 MED ORDER — MORPHINE SULFATE (PF) 2 MG/ML IV SOLN
2.0000 mg | Freq: Once | INTRAVENOUS | Status: AC
  Administered 2018-01-08: 2 mg via INTRAVENOUS
  Filled 2018-01-08: qty 1

## 2018-01-08 MED ORDER — FENTANYL CITRATE (PF) 100 MCG/2ML IJ SOLN
INTRAMUSCULAR | Status: DC | PRN
  Administered 2018-01-08 (×2): 50 ug via INTRAVENOUS

## 2018-01-08 MED ORDER — DEXAMETHASONE SODIUM PHOSPHATE 10 MG/ML IJ SOLN
INTRAMUSCULAR | Status: DC | PRN
  Administered 2018-01-08: 4 mg via INTRAVENOUS

## 2018-01-08 MED ORDER — FENTANYL CITRATE (PF) 100 MCG/2ML IJ SOLN
INTRAMUSCULAR | Status: AC
  Filled 2018-01-08: qty 2

## 2018-01-08 MED ORDER — TRAMADOL HCL 50 MG PO TABS: 50.0000 mg | ORAL_TABLET | Freq: Four times a day (QID) | ORAL | 0 refills | Status: DC | PRN

## 2018-01-08 MED ORDER — LIDOCAINE HCL (CARDIAC) PF 100 MG/5ML IV SOSY
PREFILLED_SYRINGE | INTRAVENOUS | Status: DC | PRN
  Administered 2018-01-08: 50 mg via INTRAVENOUS

## 2018-01-08 MED ORDER — SODIUM CHLORIDE 0.9 % IV BOLUS
1000.0000 mL | Freq: Once | INTRAVENOUS | Status: AC
  Administered 2018-01-08: 1000 mL via INTRAVENOUS

## 2018-01-08 MED ORDER — ONDANSETRON HCL 4 MG/2ML IJ SOLN
INTRAMUSCULAR | Status: DC | PRN
  Administered 2018-01-08: 4 mg via INTRAVENOUS

## 2018-01-08 MED ORDER — DEXAMETHASONE SODIUM PHOSPHATE 10 MG/ML IJ SOLN
INTRAMUSCULAR | Status: AC
  Filled 2018-01-08: qty 1

## 2018-01-08 MED ORDER — EPHEDRINE SULFATE 50 MG/ML IJ SOLN
INTRAMUSCULAR | Status: AC
  Filled 2018-01-08: qty 1

## 2018-01-08 MED ORDER — FENTANYL CITRATE (PF) 100 MCG/2ML IJ SOLN: 25.0000 ug | INTRAMUSCULAR | Status: DC | PRN

## 2018-01-08 MED ORDER — SUCCINYLCHOLINE CHLORIDE 20 MG/ML IJ SOLN
INTRAMUSCULAR | Status: AC
  Filled 2018-01-08: qty 1

## 2018-01-08 MED ORDER — EPHEDRINE SULFATE 50 MG/ML IJ SOLN
INTRAMUSCULAR | Status: DC | PRN
  Administered 2018-01-08: 10 mg via INTRAVENOUS

## 2018-01-08 MED ORDER — ONDANSETRON HCL 4 MG/2ML IJ SOLN
INTRAMUSCULAR | Status: AC
  Filled 2018-01-08: qty 2

## 2018-01-08 MED ORDER — IOTHALAMATE MEGLUMINE 43 % IV SOLN
INTRAVENOUS | Status: DC | PRN
  Administered 2018-01-08: 4 mL

## 2018-01-08 MED ORDER — SODIUM CHLORIDE 0.9 % IV SOLN
1.0000 g | Freq: Once | INTRAVENOUS | Status: AC
  Administered 2018-01-08: 1 g via INTRAVENOUS
  Filled 2018-01-08: qty 10

## 2018-01-08 MED ORDER — IOPAMIDOL (ISOVUE-300) INJECTION 61%
30.0000 mL | Freq: Once | INTRAVENOUS | Status: AC | PRN
  Administered 2018-01-08: 30 mL via ORAL

## 2018-01-08 MED ORDER — PROPOFOL 10 MG/ML IV BOLUS
INTRAVENOUS | Status: AC
  Filled 2018-01-08: qty 20

## 2018-01-08 MED ORDER — PROPOFOL 10 MG/ML IV BOLUS
INTRAVENOUS | Status: DC | PRN
  Administered 2018-01-08: 150 mg via INTRAVENOUS

## 2018-01-08 MED ORDER — LIDOCAINE HCL (PF) 2 % IJ SOLN
INTRAMUSCULAR | Status: AC
  Filled 2018-01-08: qty 10

## 2018-01-08 MED ORDER — SODIUM CHLORIDE 0.9 % IV SOLN
INTRAVENOUS | Status: DC | PRN
  Administered 2018-01-08: 06:00:00 via INTRAVENOUS

## 2018-01-08 MED ORDER — GLYCOPYRROLATE 0.2 MG/ML IJ SOLN
INTRAMUSCULAR | Status: AC
  Filled 2018-01-08: qty 1

## 2018-01-08 SURGICAL SUPPLY — 17 items
BAG DRAIN CYSTO-URO LG1000N (MISCELLANEOUS) ×3 IMPLANT
CATH URETL 5X70 OPEN END (CATHETERS) ×3 IMPLANT
GLOVE BIO SURGEON STRL SZ7.5 (GLOVE) ×9 IMPLANT
GOWN STRL REUS W/ TWL LRG LVL3 (GOWN DISPOSABLE) ×2 IMPLANT
GOWN STRL REUS W/TWL LRG LVL3 (GOWN DISPOSABLE) ×4
PACK CYSTO AR (MISCELLANEOUS) ×3 IMPLANT
SENSORWIRE 0.038 NOT ANGLED (WIRE) ×3
SET CYSTO W/LG BORE CLAMP LF (SET/KITS/TRAYS/PACK) ×3 IMPLANT
SOL .9 NS 3000ML IRR  AL (IV SOLUTION) ×2
SOL .9 NS 3000ML IRR UROMATIC (IV SOLUTION) ×1 IMPLANT
SOL PREP PVP 2OZ (MISCELLANEOUS) ×3
SOLUTION PREP PVP 2OZ (MISCELLANEOUS) ×1 IMPLANT
STENT URET 6FRX24 CONTOUR (STENTS) ×3 IMPLANT
STENT URET 6FRX26 CONTOUR (STENTS) IMPLANT
SURGILUBE 2OZ TUBE FLIPTOP (MISCELLANEOUS) ×3 IMPLANT
WATER STERILE IRR 1000ML POUR (IV SOLUTION) ×3 IMPLANT
WIRE SENSOR 0.038 NOT ANGLED (WIRE) ×1 IMPLANT

## 2018-01-08 NOTE — Anesthesia Procedure Notes (Signed)
Procedure Name: Intubation Performed by: Alvin Critchley, MD Pre-anesthesia Checklist: Patient identified, Patient being monitored, Timeout performed, Emergency Drugs available and Suction available Patient Re-evaluated:Patient Re-evaluated prior to induction Oxygen Delivery Method: Circle system utilized Preoxygenation: Pre-oxygenation with 100% oxygen Induction Type: IV induction Ventilation: Mask ventilation without difficulty Laryngoscope Size: Mac and 3 Grade View: Grade I Tube type: Oral Tube size: 7.0 mm Number of attempts: 1 Airway Equipment and Method: Stylet Placement Confirmation: ETT inserted through vocal cords under direct vision,  positive ETCO2 and breath sounds checked- equal and bilateral Secured at: 21 cm Tube secured with: Tape Dental Injury: Teeth and Oropharynx as per pre-operative assessment

## 2018-01-08 NOTE — H&P (Signed)
I have been asked to see the patient by Dr. Chiquita Loth, for evaluation and management of right obstructing and infected ureteral stone.  History of present illness: [name] is a 72 year old African-American female with a past medical history significant for diabetes for the past week is felt right-sided abdominal pain.  The patient was not having any lower urinary tract symptoms including frequency or urgency.  The patient does note that she has dysuria and bladder pain, especially at the end of her stream.  She denies any hematuria.  Ultimately, the pain progressed to the point where she opted to proceed to the emergency department.  In the emergency department she was found to be afebrile.  She was having some associated nausea and vomiting.  The patient has no history significant for kidney stones.  She has no history of recurrent urinary tract infections.  In the emergency department the patient underwent a CT scan to rule out appendicitis.  She was found to have a 1 cm stone at the right UVJ with gas in the right collecting system and renal pelvis.  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  I was consulted for further evaluation and management.  Review of systems: A 12 point comprehensive review of systems was obtained and is negative unless otherwise stated in the history of present illness.  Patient Active Problem List   Diagnosis Date Noted  . Internal derangement of knee 06/25/2015    No current facility-administered medications on file prior to encounter.    Current Outpatient Medications on File Prior to Encounter  Medication Sig Dispense Refill  . aspirin 81 MG tablet Take 81 mg by mouth daily.    . canagliflozin (INVOKANA) 300 MG TABS tablet Take 300 mg by mouth daily before breakfast.    . glipiZIDE (GLUCOTROL XL) 10 MG 24 hr tablet Take 10 mg by mouth daily.  2  . lovastatin (MEVACOR) 20 MG tablet Take 20 mg by mouth  daily at 6 PM.    . metFORMIN (GLUCOPHAGE) 1000 MG tablet Take 1,000 mg by mouth 2 (two) times daily with a meal.    . nystatin ointment (MYCOSTATIN) Apply 1 application topically 2 (two) times daily as needed (rash).   2  . HYDROcodone-acetaminophen (NORCO) 5-325 MG tablet Take 1-2 tablets by mouth every 4 (four) hours as needed for moderate pain. (Patient not taking: Reported on 01/08/2018) 60 tablet 0    Past Medical History:  Diagnosis Date  . Arthritis   . Diabetes mellitus without complication (HCC)   . GERD (gastroesophageal reflux disease)   . Hyperlipemia   . Hypertension   . Obesity     Past Surgical History:  Procedure Laterality Date  . ABDOMINAL HYSTERECTOMY    . COLONOSCOPY    . HEMORRHOID SURGERY    . KNEE ARTHROSCOPY Right 06/25/2015   Procedure: ARTHROSCOPY KNEE, PARTIAL MEDIAL MENISECTOMY, PARTIAL LATERAL MENISECTOMY, CHONDROPLASTY MEDIAL PATELLAR FEMORAL;  Surgeon: Donato Heinz, MD;  Location: [facility] ORS;  Service: Orthopedics;  Laterality: Right;    Social History   Tobacco Use  . Smoking status: Never Smoker  . Smokeless tobacco: Never Used  Substance Use Topics  . Alcohol use: No  . Drug use: No    Family History  Problem Relation Age of Onset  . Breast cancer Neg Hx     PE: Vitals:   01/08/18 0415 01/08/18 0424 01/08/18 0430 01/08/18 0517  BP:   Marland Kitchen)  169/129 (!) 177/66  Pulse: 81   81  Resp:    18  Temp:  97.8 F (36.6 C)  98.7 F (37.1 C)  TempSrc:  Oral  Oral  SpO2: 95%   96%  Weight:      Height:       Patient appears to be in moderate acute distress  patient is alert and oriented x3 Atraumatic normocephalic head No cervical or supraclavicular lymphadenopathy appreciated No increased work of breathing, no audible wheezes/rhonchi Regular sinus rhythm/rate Abdomen is soft, the patient has right-sided abdominal tenderness and right CVA tenderness. Lower extremities are symmetric without appreciable edema Grossly neurologically  intact No identifiable skin lesions  Recent Labs    01/08/18 0027  WBC 6.7  HGB 14.0  HCT 41.2   Recent Labs    01/08/18 0027  NA 139  K 3.9  CL 107  CO2 27  GLUCOSE 98  BUN 17  CREATININE 1.04*  CALCIUM 9.1   No results for input(s): LABPT, INR in the last 72 hours. No results for input(s): LABURIN in the last 72 hours. No results found for this or any previous visit.  Imaging: CT scan-abdomen/pelvis: I independently reviewed the images and discussed them with the patient which demonstrates a 1 cm stone at the right UVJ with mild right hydroureteronephrosis.  There is gas within the renal pelvis and the collecting system.  There is also gas surrounding the stone raising the question as to whether the patient has a right ureterocele.  Imp: The patient has an infected right ureteral stone with mild hydroureteronephrosis and a urine analysis that is concerning for infection.  She currently shows no signs of septic physiology, however her CT scan demonstrates gas within the right collecting system and the right UVJ appears abnormal, perhaps she has a ureterocele compounding her issues.  Recommendations: I spoke to the patient about her CT scan as well as the concern for urinary tract infection and the risk for impending urosepsis.  I recommended that we proceed to the operating room for stent placement.  This may also require incision of her ureterocele in order to get the stent into the right collecting system.  I described this procedure for the patient and quite a bit of detail.  She understands to expect postoperative discomfort as well as irritative voiding symptoms from the stent.  She also understands that she will need a second procedure in the coming weeks once her infection clears for definitive management of her stone.  Following the procedure we will evaluate her in the PACU, and at that time make a decision as to whether the patient needs to be admitted for observation or  whether she can be discharged home with antibiotics.   Berniece SalinesHERRICK, BENJAMIN W

## 2018-01-08 NOTE — Anesthesia Postprocedure Evaluation (Signed)
Anesthesia Post Note  Patient: Courtney CreekMargaret T Malay  Procedure(s) Performed: CYSTOSCOPY WITH STENT PLACEMENT (Right )  Patient location during evaluation: PACU Anesthesia Type: General Level of consciousness: awake and alert and oriented Pain management: pain level controlled Vital Signs Assessment: post-procedure vital signs reviewed and stable Respiratory status: spontaneous breathing Cardiovascular status: blood pressure returned to baseline Anesthetic complications: no     Last Vitals:  Vitals:   01/08/18 0747 01/08/18 0752  BP: 133/62 (!) 149/61  Pulse: 85 88  Resp: 16 16  Temp:  (!) 36.4 C  SpO2: 96% 99%    Last Pain:  Vitals:   01/08/18 0752  TempSrc: Temporal  PainSc: 0-No pain                 Lister Brizzi

## 2018-01-08 NOTE — ED Provider Notes (Signed)
[facility] Emergency Department Provider Note   ____________________________________________   First MD Initiated Contact with Patient 01/08/18 [phone]     (approximate)  I have reviewed the triage vital signs and the nursing notes.   HISTORY  Chief Complaint Abdominal Pain    HPI Courtney [name] is a 72 y.o. female who presents to the ED from home with a chief complaint of abdominal pain and frequent urination.  Symptoms started 4 to 5 days ago.  Patient noted dysuria and frequent urination.  Complains of right lower quadrant abdominal pain.  Denies associated fever, chills, chest pain, shortness of breath, nausea, vomiting, diarrhea.  Denies recent travel or trauma.   Past Medical History:  Diagnosis Date  . Arthritis   . Diabetes mellitus without complication (HCC)   . GERD (gastroesophageal reflux disease)   . Hyperlipemia   . Hypertension   . Obesity     Patient Active Problem List   Diagnosis Date Noted  . Internal derangement of knee 06/25/2015    Past Surgical History:  Procedure Laterality Date  . ABDOMINAL HYSTERECTOMY    . COLONOSCOPY    . HEMORRHOID SURGERY    . KNEE ARTHROSCOPY Right 06/25/2015   Procedure: ARTHROSCOPY KNEE, PARTIAL MEDIAL MENISECTOMY, PARTIAL LATERAL MENISECTOMY, CHONDROPLASTY MEDIAL PATELLAR FEMORAL;  Surgeon: Donato Heinz, MD;  Location: [facility] ORS;  Service: Orthopedics;  Laterality: Right;    Prior to Admission medications   Medication Sig Start Date End Date Taking? Authorizing Provider  aspirin 81 MG tablet Take 81 mg by mouth daily.    [provider]  canagliflozin (INVOKANA) 300 MG TABS tablet Take 300 mg by mouth daily before breakfast.    [provider]  glipiZIDE (GLUCOTROL XL) 10 MG 24 hr tablet Take 10 mg by mouth daily. 12/27/17   [provider]  HYDROcodone-acetaminophen (NORCO) 5-325 MG tablet Take 1-2 tablets by mouth every 4 (four) hours as needed for moderate  pain. 06/25/15   Hooten, Illene Labrador, MD  lovastatin (MEVACOR) 20 MG tablet Take 20 mg by mouth daily at 6 PM.    [provider]  meloxicam (MOBIC) 7.5 MG tablet Take 7.5 mg by mouth daily. 12/15/17   [provider]  metFORMIN (GLUCOPHAGE) 1000 MG tablet Take 1,000 mg by mouth 2 (two) times daily with a meal.    [provider]  nystatin ointment (MYCOSTATIN) Apply 1 application topically 2 (two) times daily. 11/26/17   [provider]    Allergies Ibuprofen  Family History  Problem Relation Age of Onset  . Breast cancer Neg Hx     Social History Social History   Tobacco Use  . Smoking status: Never Smoker  . Smokeless tobacco: Never Used  Substance Use Topics  . Alcohol use: No  . Drug use: No    Review of Systems  Constitutional: No fever/chills Eyes: No visual changes. ENT: No sore throat. Cardiovascular: Denies chest pain. Respiratory: Denies shortness of breath. Gastrointestinal: Positive for right lower quadrant abdominal pain.  No nausea, no vomiting.  No diarrhea.  No constipation. Genitourinary: Positive for dysuria and frequency. Musculoskeletal: Negative for back pain. Skin: Negative for rash. Neurological: Negative for headaches, focal weakness or numbness.   ____________________________________________   PHYSICAL EXAM:  VITAL SIGNS: ED Triage Vitals  Enc Vitals Group     BP 01/08/18 0024 (!) 174/81     Pulse Rate 01/08/18 0024 80     Resp 01/08/18 0024 17  Temp 01/08/18 0024 98.3 F (36.8 C)     Temp Source 01/08/18 0024 Oral     SpO2 01/08/18 0024 99 %     Weight 01/08/18 0025 185 lb (83.9 kg)     Height 01/08/18 0025 5\' 5"  (1.651 m)     Head Circumference --      Peak Flow --      Pain Score 01/08/18 0025 8     Pain Loc --      Pain Edu? --      Excl. in GC? --     Constitutional: Alert and oriented. Well appearing and in no acute distress. Eyes: Conjunctivae are normal. PERRL. EOMI. Head:  Atraumatic. Nose: No congestion/rhinnorhea. Mouth/Throat: Mucous membranes are moist.  Oropharynx non-erythematous. Neck: No stridor.   Cardiovascular: Normal rate, regular rhythm. Grossly normal heart sounds.  Good peripheral circulation. Respiratory: Normal respiratory effort.  No retractions. Lungs CTAB. Gastrointestinal: Soft and mildly tender to palpation right lower quadrant without rebound or guarding. No distention. No abdominal bruits. No CVA tenderness. Musculoskeletal: No lower extremity tenderness nor edema.  No joint effusions. Neurologic:  Normal speech and language. No gross focal neurologic deficits are appreciated. No gait instability. Skin:  Skin is warm, dry and intact. No rash noted. Psychiatric: Mood and affect are normal. Speech and behavior are normal.  ____________________________________________   LABS (all labs ordered are listed, but only abnormal results are displayed)  Labs Reviewed  LIPASE, BLOOD - Abnormal; Notable for the following components:      Result Value   Lipase 149 (*)    All other components within normal limits  COMPREHENSIVE METABOLIC PANEL - Abnormal; Notable for the following components:   Creatinine, Ser 1.04 (*)    GFR calc non Af Amer 52 (*)    All other components within normal limits  URINALYSIS, COMPLETE (UACMP) WITH MICROSCOPIC - Abnormal; Notable for the following components:   Color, Urine YELLOW (*)    APPearance CLOUDY (*)    Glucose, UA >=500 (*)    Hgb urine dipstick LARGE (*)    Nitrite POSITIVE (*)    Leukocytes, UA LARGE (*)    RBC / HPF >50 (*)    WBC, UA >50 (*)    Bacteria, UA RARE (*)    All other components within normal limits  URINE CULTURE  CBC   ____________________________________________  EKG  ED ECG REPORT I, Tyreak Reagle J, the attending physician, personally viewed and interpreted this ECG.   Date: 01/08/2018  EKG Time: 0517  Rate: 82  Rhythm: normal EKG, normal sinus rhythm  Axis: Normal   Intervals:none  ST&T Change: Nonspecific  ____________________________________________  RADIOLOGY  ED MD interpretation: 1 cm right UVJ stone with gas-forming infection  Official radiology report(s): Ct Abdomen Pelvis W Contrast  Result Date: 01/08/2018 CLINICAL DATA:  Lower quadrant abdominal pain with frequent urination EXAM: CT ABDOMEN AND PELVIS WITH CONTRAST TECHNIQUE: Multidetector CT imaging of the abdomen and pelvis was performed using the standard protocol following bolus administration of intravenous contrast. CONTRAST:  100mL ISOVUE-300 IOPAMIDOL (ISOVUE-300) INJECTION 61% COMPARISON:  None. FINDINGS: Lower chest: Lung bases demonstrate no acute consolidation or pleural effusion. The heart size is within normal limits. Mild distal esophageal thickening with hiatal hernia. Hepatobiliary: No focal liver abnormality is seen. No gallstones, gallbladder wall thickening, or biliary dilatation. Pancreas: Unremarkable. No pancreatic ductal dilatation or surrounding inflammatory changes. Spleen: Normal in size without focal abnormality. Adrenals/Urinary Tract: Adrenal glands are within normal limits. Left kidney shows  no hydronephrosis. Small locules of gas within the right renal collecting system. Mild right hydronephrosis and hydroureter. Air within the dilated right ureter. 1 cm stone within the bladder. There is surrounding focal fluid and gas bubbles around the stone. Small amount of air within the bladder. Stomach/Bowel: Stomach is nonenlarged. No dilated small bowel. Sigmoid colon diverticular disease without acute inflammatory change. Vascular/Lymphatic: Moderate aortic atherosclerosis. No aneurysm. No significantly enlarged lymph nodes. Reproductive: Status post hysterectomy. No adnexal masses. Other: No free air or free fluid. Musculoskeletal: Degenerative changes of the spine. No acute or suspicious bony abnormality. IMPRESSION: 1. Mild right hydronephrosis and hydroureter. This is secondary  to a 1 cm stone in the region of the right UVJ. Unusual configuration at the right UVJ with fluid and gas surrounding the stone, on coronal views this appears to be contiguous with the dilated ureter and could reflect inverted ureterocele. Gas is present in the bladder and extends into the right ureter and right renal collecting system, consistent with gas-forming infection. 2. Sigmoid colon diverticular disease without acute inflammatory change. Electronically Signed   By: Jasmine Pang M.D.   On: 01/08/2018 03:34    ____________________________________________   PROCEDURES  Procedure(s) performed: None  Procedures  Critical Care performed:   CRITICAL CARE Performed by: Irean Hong   Total critical care time: 45 minutes  Critical care time was exclusive of separately billable procedures and treating other patients.  Critical care was necessary to treat or prevent imminent or life-threatening deterioration.  Critical care was time spent personally by me on the following activities: development of treatment plan with patient and/or surrogate as well as nursing, discussions with consultants, evaluation of patient's response to treatment, examination of patient, obtaining history from patient or surrogate, ordering and performing treatments and interventions, ordering and review of laboratory studies, ordering and review of radiographic studies, pulse oximetry and re-evaluation of patient's condition.  ____________________________________________   INITIAL IMPRESSION / ASSESSMENT AND PLAN / ED COURSE  As part of my medical decision making, I reviewed the following data within the electronic MEDICAL RECORD NUMBER Nursing notes reviewed and incorporated, Labs reviewed, Old chart reviewed, Radiograph reviewed  and Notes from prior ED visits   72 year old female who presents with a 4 to 5-day history of right lower quadrant abdominal pain, dysuria and urinary frequency. Differential diagnosis  includes, but is not limited to, ovarian cyst, ovarian torsion, acute appendicitis, diverticulitis, urinary tract infection/pyelonephritis, endometriosis, bowel obstruction, colitis, renal colic, gastroenteritis, hernia, fibroids, endometriosis, etc.  Laboratory urinalysis results remarkable for cystitis and elevated lipase.  Patient does not have upper abdominal tenderness to exam nor is she experiencing nausea or vomiting.  Most likely pain is secondary to cystitis; however, will proceed with CT abdomen/pelvis to evaluate for appendicitis.  Will initiate IV fluid resuscitation, 2 mg IV morphine for pain paired with 4 mg IV Zofran for nausea.  Will administer 1 g IV Rocephin for UTI.  Clinical Course as of Jan 08 450  Fri Jan 08, 2018  1610 CT results noted.  Will discuss with urology on-call.  Patient currently resting.   [JS]  [phone] Spoke with Dr. Marlou Porch who will evaluate patient in the ED for probable stent placement.  Patient and family member has been updated and agreeable with plan of care.  Last meal 5:30 PM.   [JS]    Clinical Course User Index [JS] Irean Hong, MD     ____________________________________________   FINAL CLINICAL IMPRESSION(S) / ED DIAGNOSES  Final diagnoses:  Lower urinary tract infectious disease  Right lower quadrant abdominal pain  Ureteral stone with hydronephrosis     ED Discharge Orders    None       Note:  This document was prepared using Dragon voice recognition software and may include unintentional dictation errors.    Irean Hong, MD 01/08/18 [phone]

## 2018-01-08 NOTE — Telephone Encounter (Signed)
App made and patient is aware  Courtney [name]

## 2018-01-08 NOTE — ED Notes (Signed)
ED Provider at bedside. 

## 2018-01-08 NOTE — Op Note (Signed)
Preoperative diagnosis: right obstructing ureteral stone Postoperative diagnosis: Same  Procedures performed: Cystoscopy, right retrograde pyelogram with interpretation, right ureteral stent placement  Surgeon: Dr. Crist FatBenjamin W. Symphony Demuro  Findings: Cysto demostrated a bulging right ureteral orifice, the stone was impacted within the intramural ureter, with no ureterocele as initially suspected based on her imaging.  I was able to get a wire across the stone with some gentle manipulation.  A retrograde pyelogram was performed, contrast was instilled into the right collecting system and there was a no obvious filling defect, there ureter was torturous, there was moderate hydronephrosis. There were no other abnormalities.  Specimens:none, unable to get urine to aspirate through open ended catheter.  Indication: Courtney [name] is a 72 y.o. patient with infected right ureteral stone. After reviewing the management options for treatment, he elected to proceed with the above surgical procedure(s). We have discussed the potential benefits and risks of the procedure, side effects of the proposed treatment, the likelihood of the patient achieving the goals of the procedure, and any potential problems that might occur during the procedure or recuperation. Informed consent has been obtained.  Procedure in detail: Patient was consented prior to being brought back to the operating room. He was then brought back to the operating room placed on the table in supine position. General anesthesia was then induced and endotracheal tube inserted. This then placed in dorsolithotomy position and prepped and draped in the routine sterile fashion. A timeout was held.  Using a 22.5 [place] cystoscope with a 30  lens, I gently passed the scope into the patient's urethra and into the bladder under visual guidance. A 360 cystoscopic evaluation was performed with no mucosal abnormalities, no tumors, and no foreign bodies  identified. I then passed a 0. 038 Sensor wire into the right ureteral orifice and into the right renal pelvis. I then slid a 5 [place] open-ended ureteral catheter over the wire and into the renal pelvis and removed the wire.   I was unable to aspirate urine and unable to send a culture. Next I injected 4cc of contrast into the renal collecting system and performed a retrograde pyelogram. I then replaced the wire through the open-ended ureteral catheter and remove the catheter. I then slid a 24 cm x 6 French double-J ureteral stent over the wire and into the renal pelvis under fluoroscopic guidance. Once a nice curl was noted in the renal pelvis I advanced the distal end of the stent into the bladder before removing the wire completely. A final fluoroscopic image was obtained confirming the curl in the renal pelvis as well as a curl in the bladder.   Disposition: The patient returned to the PACU in stable condition.

## 2018-01-08 NOTE — ED Triage Notes (Signed)
Patient c/o LRQ abdominal pain and frequent urination beginning Monday.

## 2018-01-08 NOTE — Transfer of Care (Signed)
Immediate Anesthesia Transfer of Care Note  Patient: Courtney [name]  Procedure(s) Performed: CYSTOSCOPY WITH STENT PLACEMENT (Right )  Patient Location: PACU  Anesthesia Type:General  Level of Consciousness: awake  Airway & Oxygen Therapy: Patient Spontanous Breathing and Patient connected to face mask oxygen  Post-op Assessment: Report given to RN and Post -op Vital signs reviewed and stable  Post vital signs: Reviewed  Last Vitals:  Vitals Value Taken Time  BP 130/63 01/08/2018  6:47 AM  Temp 36.8 C 01/08/2018  6:47 AM  Pulse 102 01/08/2018  6:47 AM  Resp 21 01/08/2018  6:47 AM  SpO2 100 % 01/08/2018  6:47 AM  Vitals shown include unvalidated device data.  Last Pain:  Vitals:   01/08/18 0533  TempSrc:   PainSc: 8          Complications: No apparent anesthesia complications

## 2018-01-08 NOTE — Anesthesia Post-op Follow-up Note (Signed)
Anesthesia QCDR form completed.        

## 2018-01-08 NOTE — Discharge Instructions (Addendum)
DISCHARGE INSTRUCTIONS FOR KIDNEY STONE/URETERAL STENT   MEDICATIONS:  1.  Resume all your other meds from home - except do not take any extra narcotic pain meds that you may have at home.  2. Pyridium is to help with the burning/stinging when you urinate. 3. Tramadol is for moderate/severe pain, otherwise taking upto 1000 mg every 6 hours of plainTylenol will help treat your pain.   4. Take the Bactrim as prescribed.  ACTIVITY:  1. No strenuous activity x 1week  2. No driving while on narcotic pain medications  3. Drink plenty of water  4. Continue to walk at home - you can still get blood clots when you are at home, so keep active, but don't over do it.  5. May return to work/school tomorrow or when you feel ready   BATHING:  1. You can shower and we recommend daily showers    SIGNS/SYMPTOMS TO CALL:  Please call us if you have a fever greater than 101.5, uncontrolled nausea/vomiting, uncontrolled pain, dizziness, unable to urinate, bloody urine, chest pain, shortness of breath, leg swelling, leg pain, redness around wound, drainage from wound, or any other concerns or questions.   You can reach us at [phone].   FOLLOW-UP:  1. You will be scheduled for a follow-up appointment with urology sometime next week, somebody will contact you in the next day to schedule this appointment.   AMBULATORY SURGERY  DISCHARGE INSTRUCTIONS   1) The drugs that you were given will stay in your system until tomorrow so for the next 24 hours you should not:  A) Drive an automobile B) Make any legal decisions C) Drink any alcoholic beverage   2) You may resume regular meals tomorrow.  Today it is better to start with liquids and gradually work up to solid foods.  You may eat anything you prefer, but it is better to start with liquids, then soup and crackers, and gradually work up to solid foods.   3) Please notify your doctor immediately if you have any unusual bleeding, trouble  breathing, redness and pain at the surgery site, drainage, fever, or pain not relieved by medication.    4) Additional Instructions:        Please contact your physician with any problems or Same Day Surgery at [phone], Monday through Friday 6 am to 4 pm, or [place] [facility] [place] [place] Main number at [phone].

## 2018-01-08 NOTE — ED Notes (Signed)
[facility] , pt currently at CT.

## 2018-01-08 NOTE — ED Notes (Signed)
Patient transported to CT 

## 2018-01-08 NOTE — Telephone Encounter (Signed)
-----   Message from Crist FatBenjamin W Herrick, MD sent at 01/08/2018  6:41 AM EDT ----- Regarding: f/u Can you please place this patient into one of the providers clinics next week for f/u from stent placement for infected stone?

## 2018-01-08 NOTE — Anesthesia Preprocedure Evaluation (Addendum)
Anesthesia Evaluation  Patient identified by MRN, date of birth, ID band Patient awake    Reviewed: Allergy & Precautions, NPO status , Patient's Chart, lab work & pertinent test results, reviewed documented beta blocker date and time   Airway Mallampati: III  TM Distance: <3 FB     Dental  (+) Chipped,    Pulmonary neg pulmonary ROS,    Pulmonary exam normal        Cardiovascular hypertension, Normal cardiovascular exam     Neuro/Psych negative neurological ROS     GI/Hepatic GERD  Controlled,  Endo/Other  diabetes, Type 2  Renal/GU      Musculoskeletal  (+) Arthritis , Osteoarthritis,    Abdominal Normal abdominal exam  (+)   Peds  Hematology   Anesthesia Other Findings   Reproductive/Obstetrics                           Anesthesia Physical  Anesthesia Plan  ASA: III and emergent  Anesthesia Plan: General   Post-op Pain Management:    Induction: Intravenous  PONV Risk Score and Plan:   Airway Management Planned: Oral ETT  Additional Equipment:   Intra-op Plan:   Post-operative Plan: Extubation in OR  Informed Consent: I have reviewed the patients History and Physical, chart, labs and discussed the procedure including the risks, benefits and alternatives for the proposed anesthesia with the patient or authorized representative who has indicated his/her understanding and acceptance.     Plan Discussed with: CRNA  Anesthesia Plan Comments:        Anesthesia Quick Evaluation

## 2018-01-10 LAB — URINE CULTURE

## 2018-01-11 ENCOUNTER — Ambulatory Visit (INDEPENDENT_AMBULATORY_CARE_PROVIDER_SITE_OTHER): Payer: Medicare Other | Admitting: Urology

## 2018-01-11 VITALS — BP 126/78 | HR 97 | Ht 65.0 in | Wt 179.2 lb

## 2018-01-11 DIAGNOSIS — N201 Calculus of ureter: Secondary | ICD-10-CM | POA: Diagnosis not present

## 2018-01-11 NOTE — Progress Notes (Signed)
   01/11/2018 5:08 PM   Courtney [name] 04-Mar-1946 [id number]  Referring provider: Barbette ReichmannHande, Vishwanath, MD [address] [facility] [place], [place] [address]  CC: Right 1cm UVJ stone, pre-stented for infection  HPI: I had the pleasure of seeing Courtney [name] in urology clinic today.  Briefly, she is a 72 year old African-American female with diabetes who recently underwent right ureteral stent placement 8/23 with Dr. Marlou PorchHerrick for a right 1 cm infected UVJ stone.  She is on culture appropriate Bactrim for [name] E. coli UTI.  She has been doing well since discharge, aside from some mild stent related symptoms.  She reports some mild hematuria since the stent has been in place.  Denies fevers, chills, or flank pain.   PMH: Past Medical History:  Diagnosis Date  . Arthritis   . Diabetes mellitus without complication (HCC)   . GERD (gastroesophageal reflux disease)   . Hyperlipemia   . Hypertension   . Obesity     Surgical History: Past Surgical History:  Procedure Laterality Date  . ABDOMINAL HYSTERECTOMY    . COLONOSCOPY    . CYSTOSCOPY WITH STENT PLACEMENT Right 01/08/2018   Procedure: CYSTOSCOPY WITH STENT PLACEMENT;  Surgeon: Crist FatHerrick, Benjamin W, MD;  Location: [facility] ORS;  Service: Urology;  Laterality: Right;  . HEMORRHOID SURGERY    . KNEE ARTHROSCOPY Right 06/25/2015   Procedure: ARTHROSCOPY KNEE, PARTIAL MEDIAL MENISECTOMY, PARTIAL LATERAL MENISECTOMY, CHONDROPLASTY MEDIAL PATELLAR FEMORAL;  Surgeon: Donato HeinzJames P Hooten, MD;  Location: [facility] ORS;  Service: Orthopedics;  Laterality: Right;    Allergies:  Allergies  Allergen Reactions  . Ibuprofen Hives    PATIENT DENIES    Family History: Family History  Problem Relation Age of Onset  . Breast cancer Neg Hx     Social History:  reports that she has never smoked. She has never used smokeless tobacco. She reports that she does not drink alcohol or use drugs.  ROS: Please see flowsheet from today's date for  complete review of systems.  Physical Exam: BP 126/78 (BP Location: Left Arm, Patient Position: Sitting, Cuff Size: Normal)   Pulse 97   Ht 5\' 5"  (1.651 m)   Wt 179 lb 3.2 oz (81.3 kg)   BMI 29.82 kg/m    Constitutional:  Alert and oriented, No acute distress. Cardiovascular: No clubbing, cyanosis, or edema, RRR Respiratory: Normal respiratory effort, no increased work of breathing, CTA bilaterally GI: Abdomen is soft, nontender, nondistended, no abdominal masses Lymph: No cervical or inguinal lymphadenopathy. Skin: No rashes, bruises or suspicious lesions. Neurologic: Grossly intact, no focal deficits, moving all 4 extremities. Psychiatric: Normal mood and affect.  Pertinent Imaging: I have personally reviewed the CT dated 01/08/2018: There is a large 1 cm right UVJ stone with upstream hydronephrosis  Assessment & Plan:   In summary, Courtney [name] is a 72 year old female who recently underwent right ureteral stent placement 8/23 with Dr. Marlou PorchHerrick for [name] infected 1 cm right UVJ stone.  We will schedule her for right ureteroscopy and laser lithotripsy after she has completed 2 weeks of antibiotic therapy for UTI.  We discussed possible complications including bleeding, infection, ureteral injury and stent related morbidity.  She is in agreement to proceed.  Schedule for RIGHT URS/LL/stent exchange Start treatment dose Bactrim 2 days prior to surgery to sterilize urine in setting of stent   Sondra ComeBrian C Sawyer Mentzer, MD  [facility] [address], [address] [place], [place] [address] [phone]

## 2018-01-14 ENCOUNTER — Other Ambulatory Visit: Payer: Self-pay | Admitting: Radiology

## 2018-01-14 DIAGNOSIS — N201 Calculus of ureter: Secondary | ICD-10-CM

## 2018-01-14 MED ORDER — SULFAMETHOXAZOLE-TRIMETHOPRIM 800-160 MG PO TABS: 1.0000 | ORAL_TABLET | Freq: Two times a day (BID) | ORAL | 0 refills | Status: DC

## 2018-01-19 ENCOUNTER — Telehealth: Payer: Self-pay | Admitting: Urology

## 2018-01-19 NOTE — Telephone Encounter (Signed)
Outpatient Surgery Insurance Authorization CPT [phone] - Right URS, stent exchange DOS 01/29/2018 Dr. Legrand Rams  [facility] Medicare [phone]), Spoke to Maple Mirza.  Prior authorization is NOT required (Reference [id number]).  [place] 01/19/18 1224pm

## 2018-01-22 ENCOUNTER — Other Ambulatory Visit: Payer: Self-pay

## 2018-01-22 ENCOUNTER — Encounter
Admission: RE | Admit: 2018-01-22 | Discharge: 2018-01-22 | Disposition: A | Discharge status: Home / Self Care | Payer: Medicare Other | Source: Ambulatory Visit | Attending: Urology | Admitting: Urology

## 2018-01-22 DIAGNOSIS — E119 Type 2 diabetes mellitus without complications: Secondary | ICD-10-CM | POA: Diagnosis not present

## 2018-01-22 DIAGNOSIS — E669 Obesity, unspecified: Secondary | ICD-10-CM | POA: Diagnosis not present

## 2018-01-22 DIAGNOSIS — K219 Gastro-esophageal reflux disease without esophagitis: Secondary | ICD-10-CM | POA: Insufficient documentation

## 2018-01-22 DIAGNOSIS — I10 Essential (primary) hypertension: Secondary | ICD-10-CM | POA: Diagnosis not present

## 2018-01-22 DIAGNOSIS — Z01812 Encounter for preprocedural laboratory examination: Secondary | ICD-10-CM | POA: Insufficient documentation

## 2018-01-22 DIAGNOSIS — E785 Hyperlipidemia, unspecified: Secondary | ICD-10-CM | POA: Insufficient documentation

## 2018-01-22 HISTORY — DX: Nausea with vomiting, unspecified: Z98.890

## 2018-01-22 HISTORY — DX: Adverse effect of unspecified anesthetic, initial encounter: T41.45XA

## 2018-01-22 HISTORY — DX: Other specified postprocedural states: R11.2

## 2018-01-22 HISTORY — DX: Other complications of anesthesia, initial encounter: T88.59XA

## 2018-01-22 HISTORY — DX: Personal history of urinary calculi: Z87.442

## 2018-01-22 LAB — URINALYSIS, ROUTINE W REFLEX MICROSCOPIC
BACTERIA UA: NONE SEEN
Bilirubin Urine: NEGATIVE
Ketones, ur: NEGATIVE mg/dL
Nitrite: NEGATIVE
PROTEIN: 100 mg/dL — AB
Specific Gravity, Urine: 1.025 (ref 1.005–1.030)
pH: 5 (ref 5.0–8.0)

## 2018-01-22 NOTE — Patient Instructions (Signed)
Your procedure is scheduled on: Friday, January 29, 2018 Report to Day Surgery on the 2nd floor of the [facility] To find out your arrival time, please call [phone] between 1PM - 3PM on: Thursday, January 28, 2018  REMEMBER: Instructions that are not followed completely may result in serious medical risk, up to and including death; or upon the discretion of your surgeon and anesthesiologist your surgery may need to be rescheduled.  Do not eat food after midnight the night before surgery.  No gum chewing, lozengers or hard candies.  You may however, drink water up to 2 hours before you are scheduled to arrive for your surgery. Do not drink anything within 2 hours of the start of your surgery.  No Alcohol for 24 hours before or after surgery.  No Smoking including e-cigarettes for 24 hours prior to surgery.  No chewable tobacco products for at least 6 hours prior to surgery.  No nicotine patches on the day of surgery.  On the morning of surgery brush your teeth with toothpaste and water, you may rinse your mouth with mouthwash if you wish. Do not swallow any toothpaste or mouthwash.  Notify your doctor if there is any change in your medical condition (cold, fever, infection).  Do not wear jewelry, make-up, hairpins, clips or nail polish.  Do not wear lotions, powders, or perfumes. You may wear deodorant.  Do not shave 48 hours prior to surgery.   Contacts and dentures may not be worn into surgery.  Do not bring valuables to the hospital, including drivers license, insurance or credit cards.  [place] is not responsible for any belongings or valuables.   TAKE THESE MEDICATIONS THE MORNING OF SURGERY:  none  Stop Metformin 2 days prior to surgery.  Last day to take is Tuesday, September 10; resume after surgery.  NOW!  Stop ASPIRIN and Anti-inflammatories (NSAIDS) such as Advil, Aleve, Ibuprofen, Motrin, Naproxen, Naprosyn and Aspirin based products such as  Excedrin, Goodys Powder, BC Powder. (May take Tylenol or Acetaminophen if needed.)  NOW!  Stop ANY OVER THE COUNTER supplements until after surgery.  Wear comfortable clothing (specific to your surgery type) to the hospital.  If you are being discharged the day of surgery, you will not be allowed to drive home. You will need a responsible adult to drive you home and stay with you that night.   If you are taking public transportation, you will need to have a responsible adult with you. Please confirm with your physician that it is acceptable to use public transportation.   Please call [phone] if you have any questions about these instructions.

## 2018-01-23 LAB — URINE CULTURE: Culture: <10000 — AB

## 2018-01-23 NOTE — Pre-Procedure Instructions (Signed)
UA faxed to Dr. Richardo Hanks for review.

## 2018-01-28 MED ORDER — CIPROFLOXACIN IN D5W 400 MG/200ML IV SOLN
400.0000 mg | INTRAVENOUS | Status: AC
  Administered 2018-01-29: 400 mg via INTRAVENOUS

## 2018-01-29 ENCOUNTER — Other Ambulatory Visit: Payer: Self-pay

## 2018-01-29 ENCOUNTER — Ambulatory Visit: Payer: Medicare Other | Admitting: Anesthesiology

## 2018-01-29 ENCOUNTER — Encounter
Admission: RE | Disposition: A | Discharge status: Home / Self Care | Payer: Self-pay | Source: Ambulatory Visit | Attending: Urology

## 2018-01-29 ENCOUNTER — Ambulatory Visit
Admission: RE | Admit: 2018-01-29 | Discharge: 2018-01-29 | Disposition: A | Discharge status: Home / Self Care | Payer: Medicare Other | Source: Ambulatory Visit | Attending: Urology | Admitting: Urology

## 2018-01-29 ENCOUNTER — Telehealth: Payer: Self-pay | Admitting: Urology

## 2018-01-29 DIAGNOSIS — N2889 Other specified disorders of kidney and ureter: Secondary | ICD-10-CM | POA: Insufficient documentation

## 2018-01-29 DIAGNOSIS — Z6829 Body mass index (BMI) 29.0-29.9, adult: Secondary | ICD-10-CM | POA: Insufficient documentation

## 2018-01-29 DIAGNOSIS — Z79899 Other long term (current) drug therapy: Secondary | ICD-10-CM | POA: Insufficient documentation

## 2018-01-29 DIAGNOSIS — I10 Essential (primary) hypertension: Secondary | ICD-10-CM | POA: Insufficient documentation

## 2018-01-29 DIAGNOSIS — N201 Calculus of ureter: Secondary | ICD-10-CM | POA: Diagnosis present

## 2018-01-29 DIAGNOSIS — E669 Obesity, unspecified: Secondary | ICD-10-CM | POA: Diagnosis not present

## 2018-01-29 DIAGNOSIS — Z7984 Long term (current) use of oral hypoglycemic drugs: Secondary | ICD-10-CM | POA: Diagnosis not present

## 2018-01-29 DIAGNOSIS — E785 Hyperlipidemia, unspecified: Secondary | ICD-10-CM | POA: Insufficient documentation

## 2018-01-29 DIAGNOSIS — E119 Type 2 diabetes mellitus without complications: Secondary | ICD-10-CM | POA: Insufficient documentation

## 2018-01-29 HISTORY — PX: CYSTOSCOPY/URETEROSCOPY/HOLMIUM LASER/STENT PLACEMENT: SHX6546

## 2018-01-29 LAB — GLUCOSE, CAPILLARY
Glucose-Capillary: 139 mg/dL — ABNORMAL HIGH (ref 70–99)
Glucose-Capillary: 138 mg/dL — ABNORMAL HIGH (ref 70–99)

## 2018-01-29 SURGERY — CYSTOSCOPY/URETEROSCOPY/HOLMIUM LASER/STENT PLACEMENT
Anesthesia: General | Laterality: Right

## 2018-01-29 MED ORDER — CIPROFLOXACIN IN D5W 400 MG/200ML IV SOLN
INTRAVENOUS | Status: AC
  Filled 2018-01-29: qty 200

## 2018-01-29 MED ORDER — PROPOFOL 10 MG/ML IV BOLUS
INTRAVENOUS | Status: AC
  Filled 2018-01-29: qty 20

## 2018-01-29 MED ORDER — DEXAMETHASONE SODIUM PHOSPHATE 10 MG/ML IJ SOLN
INTRAMUSCULAR | Status: DC | PRN
  Administered 2018-01-29: 5 mg via INTRAVENOUS

## 2018-01-29 MED ORDER — FENTANYL CITRATE (PF) 100 MCG/2ML IJ SOLN: 25.0000 ug | INTRAMUSCULAR | Status: DC | PRN

## 2018-01-29 MED ORDER — SCOPOLAMINE 1 MG/3DAYS TD PT72
1.0000 | MEDICATED_PATCH | Freq: Once | TRANSDERMAL | Status: DC
  Administered 2018-01-29: 1.5 mg via TRANSDERMAL

## 2018-01-29 MED ORDER — SUCCINYLCHOLINE CHLORIDE 20 MG/ML IJ SOLN
INTRAMUSCULAR | Status: DC | PRN
  Administered 2018-01-29: 100 mg via INTRAVENOUS

## 2018-01-29 MED ORDER — FENTANYL CITRATE (PF) 100 MCG/2ML IJ SOLN
INTRAMUSCULAR | Status: AC
  Filled 2018-01-29: qty 2

## 2018-01-29 MED ORDER — SODIUM CHLORIDE 0.9 % IV SOLN
INTRAVENOUS | Status: DC
  Administered 2018-01-29: 07:00:00 via INTRAVENOUS

## 2018-01-29 MED ORDER — IOPAMIDOL (ISOVUE-M 200) INJECTION 41%
INTRAMUSCULAR | Status: DC | PRN
  Administered 2018-01-29: 15 mL

## 2018-01-29 MED ORDER — LIDOCAINE HCL (CARDIAC) PF 100 MG/5ML IV SOSY
PREFILLED_SYRINGE | INTRAVENOUS | Status: DC | PRN
  Administered 2018-01-29: 100 mg via INTRAVENOUS

## 2018-01-29 MED ORDER — FAMOTIDINE 20 MG PO TABS
20.0000 mg | ORAL_TABLET | Freq: Once | ORAL | Status: AC
  Administered 2018-01-29: 20 mg via ORAL

## 2018-01-29 MED ORDER — ONDANSETRON HCL 4 MG/2ML IJ SOLN
INTRAMUSCULAR | Status: DC | PRN
  Administered 2018-01-29: 4 mg via INTRAVENOUS

## 2018-01-29 MED ORDER — PROPOFOL 10 MG/ML IV BOLUS
INTRAVENOUS | Status: DC | PRN
  Administered 2018-01-29: 150 mg via INTRAVENOUS
  Administered 2018-01-29: 100 mg via INTRAVENOUS

## 2018-01-29 MED ORDER — FENTANYL CITRATE (PF) 100 MCG/2ML IJ SOLN
INTRAMUSCULAR | Status: DC | PRN
  Administered 2018-01-29: 100 ug via INTRAVENOUS

## 2018-01-29 MED ORDER — TAMSULOSIN HCL 0.4 MG PO CAPS: 0.4000 mg | ORAL_CAPSULE | Freq: Every day | ORAL | 0 refills | Status: DC

## 2018-01-29 MED ORDER — SCOPOLAMINE 1 MG/3DAYS TD PT72
MEDICATED_PATCH | TRANSDERMAL | Status: AC
  Administered 2018-01-29: 1.5 mg via TRANSDERMAL
  Filled 2018-01-29: qty 1

## 2018-01-29 MED ORDER — HYDROCODONE-ACETAMINOPHEN 5-325 MG PO TABS: 1.0000 | ORAL_TABLET | ORAL | 0 refills | Status: AC | PRN

## 2018-01-29 MED ORDER — SULFAMETHOXAZOLE-TRIMETHOPRIM 800-160 MG PO TABS: 1.0000 | ORAL_TABLET | Freq: Every day | ORAL | 0 refills | Status: DC

## 2018-01-29 MED ORDER — PHENYLEPHRINE HCL 10 MG/ML IJ SOLN
INTRAMUSCULAR | Status: DC | PRN
  Administered 2018-01-29: 100 ug via INTRAVENOUS
  Administered 2018-01-29: 50 ug via INTRAVENOUS

## 2018-01-29 MED ORDER — LACTATED RINGERS IV SOLN
INTRAVENOUS | Status: DC | PRN
  Administered 2018-01-29: 08:00:00 via INTRAVENOUS

## 2018-01-29 MED ORDER — FAMOTIDINE 20 MG PO TABS
ORAL_TABLET | ORAL | Status: AC
  Administered 2018-01-29: 20 mg via ORAL
  Filled 2018-01-29: qty 1

## 2018-01-29 MED ORDER — ONDANSETRON HCL 4 MG/2ML IJ SOLN: 4.0000 mg | Freq: Once | INTRAMUSCULAR | Status: DC | PRN

## 2018-01-29 SURGICAL SUPPLY — 30 items
BAG DRAIN CYSTO-URO LG1000N (MISCELLANEOUS) ×3 IMPLANT
BRUSH SCRUB EZ 1% IODOPHOR (MISCELLANEOUS) ×3 IMPLANT
BULB IRRIG PATHFIND (MISCELLANEOUS) ×3 IMPLANT
CATH URETL 5X70 OPEN END (CATHETERS) IMPLANT
CNTNR SPEC 2.5X3XGRAD LEK (MISCELLANEOUS)
CONT SPEC 4OZ STER OR WHT (MISCELLANEOUS)
CONTAINER SPEC 2.5X3XGRAD LEK (MISCELLANEOUS) IMPLANT
DRAPE UTILITY 15X26 TOWEL STRL (DRAPES) ×3 IMPLANT
FIBER LASER LITHO 273 (Laser) ×2 IMPLANT
GLOVE BIOGEL PI IND STRL 7.5 (GLOVE) ×1 IMPLANT
GLOVE BIOGEL PI INDICATOR 7.5 (GLOVE) ×4
GOWN STRL REUS W/ TWL LRG LVL3 (GOWN DISPOSABLE) ×1 IMPLANT
GOWN STRL REUS W/ TWL XL LVL3 (GOWN DISPOSABLE) ×1 IMPLANT
GOWN STRL REUS W/TWL LRG LVL3 (GOWN DISPOSABLE) ×2
GOWN STRL REUS W/TWL XL LVL3 (GOWN DISPOSABLE) ×2
INTRODUCER DILATOR DOUBLE (INTRODUCER) IMPLANT
KIT TURNOVER CYSTO (KITS) ×3 IMPLANT
PACK CYSTO AR (MISCELLANEOUS) ×3 IMPLANT
SENSORWIRE 0.038 NOT ANGLED (WIRE) ×3
SET CYSTO W/LG BORE CLAMP LF (SET/KITS/TRAYS/PACK) ×3 IMPLANT
SHEATH URETERAL 12FRX35CM (MISCELLANEOUS) IMPLANT
SOL .9 NS 3000ML IRR  AL (IV SOLUTION) ×2
SOL .9 NS 3000ML IRR UROMATIC (IV SOLUTION) ×1 IMPLANT
STENT URET 6FRX24 CONTOUR (STENTS) ×2 IMPLANT
STENT URET 6FRX26 CONTOUR (STENTS) IMPLANT
SURGILUBE 2OZ TUBE FLIPTOP (MISCELLANEOUS) ×3 IMPLANT
SYR 10ML LL (SYRINGE) ×3 IMPLANT
TUBING ART PRESS 48 MALE/FEM (TUBING) ×3 IMPLANT
WATER STERILE IRR 1000ML POUR (IV SOLUTION) ×3 IMPLANT
WIRE SENSOR 0.038 NOT ANGLED (WIRE) ×1 IMPLANT

## 2018-01-29 NOTE — Discharge Instructions (Signed)

## 2018-01-29 NOTE — Telephone Encounter (Signed)
done

## 2018-01-29 NOTE — Anesthesia Post-op Follow-up Note (Signed)
Anesthesia QCDR form completed.        

## 2018-01-29 NOTE — H&P (Signed)
UROLOGY H&P UPDATE  Agree with prior H&P dated 01/11/2018, right 1cm distal ureteral stone, pre-stented for E Coli UTI.  Cardiac: RRR Lungs: CTA bilaterally  Laterality: RIGHT Procedure: RIGHT URS/LL/Stent exchange  Urinalysis: Culture negative 9/6  Informed consent obtained, we specifically discussed the risks of bleeding, infection, need for additional procedures, stent related symptoms.  Sondra ComeBrian C Asher Babilonia, MD 01/29/2018

## 2018-01-29 NOTE — Op Note (Signed)
Date of procedure: 01/29/18  Preoperative diagnosis:  1. Right distal ureteral stone, 1 cm  Postoperative diagnosis:  1. Same  Procedure: 1. Cystoscopy, right ureteroscopy, laser lithotripsy, stent placement 2. Right retrograde pyelogram with intraoperative interpretation  Surgeon: Legrand RamsBrian Kaniel Kiang, MD  Anesthesia: General  Complications: None  Intraoperative findings: Normal cystoscopy.  Large cystocele.  Yellow and black stone consistent with calcium oxalate fragmented to dust and irrigated free.  Uncomplicated right stent placement  EBL: Minimal  Specimens: None  Drains: Right 6 French X 24 cm stent  Indication: Arleta CreekMargaret T Boothe is a 72 y.o. patient with right 1 cm distal ureteral stone, pre-stented for E. coli UTI.Marland Kitchen.  After reviewing the management options for treatment, they elected to proceed with the above surgical procedure(s). We have discussed the potential benefits and risks of the procedure, side effects of the proposed treatment, the likelihood of the patient achieving the goals of the procedure, and any potential problems that might occur during the procedure or recuperation. Informed consent has been obtained.  Description of procedure:  The patient was taken to the operating room and general anesthesia was induced.  The patient was placed in the dorsal lithotomy position, prepped and draped in the usual sterile fashion, and preoperative antibiotics(ciprofloxacin) were administered. A preoperative time-out was performed.   The 21 French rigid cystoscope with a 30 degree lens was used to intubate the urethra.  Cystoscopy was performed showing grossly normal mucosa throughout the bladder.  The flexible stent graspers were used to grasp the right ureteral stent and this was pulled to the meatus.  A 0.035 sensor wire was used to intubate the stent and was passed up to the collecting system under fluoroscopy.  The old stent was removed and discarded.  We then passed a  semirigid ureteroscope alongside the wire and immediately identified a large yellow stone in the distal ureter.  This was fragmented to dust using settings of 10 Hz and 1.0 J.  These fragments were irrigated free of the bladder.  Thorough ureteroscopy did not demonstrate any further fragments.  Notably there was a bit of a diverticulum in the distal ureter that the stone had been lodged in.  We then injected contrast through the scope and opacified the collecting system to aid in stent placement.  No filling defects were noted.  A 6FX 24 cm stent was passed over the wire manually and a good curl was noted in the upper pole.  We then passed the rigid cystoscope again and confirmed good curl in the bladder.  The bladder was drained and this concluded procedure  Disposition: Stable to PACU  Plan: Stent removal in clinic in one week  Legrand RamsBrian Skyler Carel, MD

## 2018-01-29 NOTE — Anesthesia Procedure Notes (Signed)
Procedure Name: Intubation Performed by: Marcy Siren, CRNA Pre-anesthesia Checklist: Patient identified, Emergency Drugs available, Suction available, Patient being monitored and Timeout performed Patient Re-evaluated:Patient Re-evaluated prior to induction Oxygen Delivery Method: Circle system utilized Preoxygenation: Pre-oxygenation with 100% oxygen Induction Type: IV induction Ventilation: Mask ventilation without difficulty Laryngoscope Size: Mac and 3 Grade View: Grade I Tube type: Oral Tube size: 7.0 mm Number of attempts: 1 Airway Equipment and Method: Stylet Placement Confirmation: ETT inserted through vocal cords under direct vision,  positive ETCO2,  CO2 detector and breath sounds checked- equal and bilateral Secured at: 21 cm Tube secured with: Tape Dental Injury: Teeth and Oropharynx as per pre-operative assessment

## 2018-01-29 NOTE — Transfer of Care (Signed)
Immediate Anesthesia Transfer of Care Note  Patient: Courtney [name]  Procedure(s) Performed: CYSTOSCOPY/URETEROSCOPY/HOLMIUM LASER/STENT Exchange (Right )  Patient Location: PACU  Anesthesia Type:General  Level of Consciousness: awake  Airway & Oxygen Therapy: Patient Spontanous Breathing  Post-op Assessment: Report given to RN  Post vital signs: stable  Last Vitals:  Vitals Value Taken Time  BP    Temp    Pulse    Resp    SpO2      Last Pain:  Vitals:   01/29/18 0622  TempSrc: Oral  PainSc: 0-No pain         Complications: No apparent anesthesia complications

## 2018-01-29 NOTE — OR Nursing (Signed)
Right lower arm infiltration from OR remains swollen and pink. Pt instrted to ice area 20 minutes on and 20 minutes off until swelling less.

## 2018-01-29 NOTE — Anesthesia Postprocedure Evaluation (Signed)
Anesthesia Post Note  Patient: Courtney [name]  Procedure(s) Performed: CYSTOSCOPY/URETEROSCOPY/HOLMIUM LASER/STENT Exchange (Right )  Patient location during evaluation: PACU Anesthesia Type: General Level of consciousness: awake and alert and oriented Pain management: pain level controlled Vital Signs Assessment: post-procedure vital signs reviewed and stable Respiratory status: spontaneous breathing Cardiovascular status: blood pressure returned to baseline Anesthetic complications: no     Last Vitals:  Vitals:   01/29/18 0939 01/29/18 0951  BP: (!) 181/60 (!) 169/58  Pulse:    Resp: 16   Temp: (!) 36.1 C   SpO2: 100%     Last Pain:  Vitals:   01/29/18 0939  TempSrc:   PainSc: 0-No pain                 Artis Beggs

## 2018-01-29 NOTE — OR Nursing (Signed)
Discussed discharge in i structions with pt and son. Both voice understanding.

## 2018-01-29 NOTE — Addendum Note (Signed)
Addendum  created 01/29/18 1620 by Stormy Fabianurtis, Evana Runnels, CRNA   Charge Capture section accepted

## 2018-01-29 NOTE — Anesthesia Preprocedure Evaluation (Signed)
Anesthesia Evaluation  Patient identified by MRN, date of birth, ID band Patient awake    Reviewed: Allergy & Precautions, NPO status , Patient's Chart, lab work & pertinent test results, reviewed documented beta blocker date and time   History of Anesthesia Complications (+) PONV and history of anesthetic complications  Airway Mallampati: III  TM Distance: <3 FB     Dental  (+) Chipped,    Pulmonary neg pulmonary ROS,    Pulmonary exam normal        Cardiovascular hypertension, Normal cardiovascular exam     Neuro/Psych negative neurological ROS     GI/Hepatic GERD  Controlled,  Endo/Other  diabetes, Type 2  Renal/GU      Musculoskeletal  (+) Arthritis , Osteoarthritis,    Abdominal Normal abdominal exam  (+)   Peds  Hematology   Anesthesia Other Findings   Reproductive/Obstetrics                             Anesthesia Physical  Anesthesia Plan  ASA: III and emergent  Anesthesia Plan: General   Post-op Pain Management:    Induction: Intravenous  PONV Risk Score and Plan:   Airway Management Planned: Oral ETT  Additional Equipment:   Intra-op Plan:   Post-operative Plan: Extubation in OR  Informed Consent: I have reviewed the patients History and Physical, chart, labs and discussed the procedure including the risks, benefits and alternatives for the proposed anesthesia with the patient or authorized representative who has indicated his/her understanding and acceptance.     Plan Discussed with: CRNA  Anesthesia Plan Comments:         Anesthesia Quick Evaluation

## 2018-01-29 NOTE — Telephone Encounter (Signed)
-----   Message from Sondra ComeBrian C Sninsky, MD sent at 01/29/2018 10:13 AM EDT ----- Regarding: FW: stent removal Yup, 19th is fine. Thanks for checking  Arlys JohnBrian  ----- Message ----- From: Kipp BroodBell, Tammy M Sent: 01/29/2018   9:11 AM EDT To: Sondra ComeBrian C Sninsky, MD Subject: RE: stent removal                              You are not here on the 20th is the 19th ok?If not then it will have to be the 23rd? Marcelino DusterMichelle ----- Message ----- From: Sondra ComeSninsky, Brian C, MD Sent: 01/29/2018   9:06 AM EDT To: Leroy LibmanBua Admin Subject: stent removal                                  Please schedule stent removal in one week.  Sondra ComeBrian C Sninsky, MD 01/29/2018

## 2018-02-04 ENCOUNTER — Other Ambulatory Visit: Payer: Self-pay

## 2018-02-04 ENCOUNTER — Ambulatory Visit (INDEPENDENT_AMBULATORY_CARE_PROVIDER_SITE_OTHER): Payer: Medicare Other | Admitting: Urology

## 2018-02-04 ENCOUNTER — Encounter: Payer: Self-pay | Admitting: Urology

## 2018-02-04 VITALS — BP 154/88 | HR 77 | Ht 65.0 in | Wt 181.6 lb

## 2018-02-04 DIAGNOSIS — N201 Calculus of ureter: Secondary | ICD-10-CM | POA: Diagnosis not present

## 2018-02-04 NOTE — Addendum Note (Signed)
Addended by: Martha ClanWATTS, Trust Crago M on: 02/04/2018 04:54 PM   Modules accepted: Orders

## 2018-02-04 NOTE — Progress Notes (Signed)
Cystoscopy Procedure Note:  Indication: Stent removal s/p RIGHT URS/LL/Stent 01/29/2018 for 1cm distal ureteral stone. Was pre-stented for infection by Dr. Marlou PorchHerrick 01/08/2018.  After informed consent and discussion of the procedure and its risks, Courtney [name] was positioned and prepped in the standard fashion. Cystoscopy was performed with a flexible cystoscope. The stent was grasped with flexible graspers and removed in its entirety. The patient tolerated the procedure well.  Findings: Uncomplicated stent removal  Assessment and Plan: Follow up in 4 weeks with renal ultrasound to evaluate for silent hydronephrosis  Legrand RamsBrian Nicoli Nardozzi, MD 02/04/2018

## 2018-02-22 ENCOUNTER — Other Ambulatory Visit: Payer: Self-pay | Admitting: Internal Medicine

## 2018-02-22 DIAGNOSIS — Z1231 Encounter for screening mammogram for malignant neoplasm of breast: Secondary | ICD-10-CM

## 2018-03-01 ENCOUNTER — Ambulatory Visit
Admission: RE | Admit: 2018-03-01 | Discharge: 2018-03-01 | Disposition: A | Discharge status: Home / Self Care | Payer: Medicare Other | Source: Ambulatory Visit | Attending: Urology | Admitting: Urology

## 2018-03-01 DIAGNOSIS — N201 Calculus of ureter: Secondary | ICD-10-CM

## 2018-03-01 DIAGNOSIS — Z87442 Personal history of urinary calculi: Secondary | ICD-10-CM | POA: Diagnosis not present

## 2018-03-03 ENCOUNTER — Other Ambulatory Visit: Payer: Self-pay

## 2018-03-03 ENCOUNTER — Ambulatory Visit: Payer: Medicare Other | Admitting: Urology

## 2018-03-03 ENCOUNTER — Ambulatory Visit (INDEPENDENT_AMBULATORY_CARE_PROVIDER_SITE_OTHER): Payer: Medicare Other | Admitting: Urology

## 2018-03-03 ENCOUNTER — Encounter: Payer: Self-pay | Admitting: Urology

## 2018-03-03 VITALS — BP 125/73 | HR 76 | Ht 65.0 in | Wt 181.8 lb

## 2018-03-03 DIAGNOSIS — N200 Calculus of kidney: Secondary | ICD-10-CM | POA: Diagnosis not present

## 2018-03-03 NOTE — Progress Notes (Signed)
   03/03/2018 8:33 AM   Courtney [name] [birth year] [id number]  Reason for visit: Follow up R URS/LL/stent  HPI: I saw Courtney [name] back in urology clinic today for follow-up after right ureteroscopy, laser lithotripsy, and stent placement.  She is a very nice 72 year old African-American female who was originally stented on 01/08/2018 with Dr. Marlou Porch for a right 1 cm infected distal ureteral stone.  She underwent definitive ureteroscopy with me on 01/29/2018, and stent removal on 02/04/2018.  She presents today to discuss her renal ultrasound results.  Ultrasound shows no residual hydronephrosis or stones.  She reports she is doing very well and denies any fevers, chills, flank pain, or gross hematuria.   ROS: Please see flowsheet from today's date for complete review of systems.  Physical Exam: BP 125/73   Pulse 76   Ht 5\' 5"  (1.651 m)   Wt 181 lb 12.8 oz (82.5 kg)   BMI 30.25 kg/m    Constitutional:  Alert and oriented, No acute distress. Respiratory: Normal respiratory effort, no increased work of breathing. GI: Abdomen is soft, nontender, nondistended, no abdominal masses GU: No CVA tenderness Skin: No rashes, bruises or suspicious lesions. Neurologic: Grossly intact, no focal deficits, moving all 4 extremities. Psychiatric: Normal mood and affect  Laboratory Data: None to review  Pertinent Imaging: I have personally reviewed the renal ultrasound dated 03/01/2018: There is no residual hydronephrosis, no residual stone disease.  Assessment & Plan:   In summary, Courtney [name] is a 72 year old African-American female who was treated with ureteroscopy for a 1.3 cm right distal ureteral stone.  Her follow-up ultrasound today shows no residual hydronephrosis or stone disease, and she is doing very well.  We discussed general stone prevention strategies including adequate hydration with goal of producing 2.5 L of urine daily, increasing citric acid intake, increasing calcium  intake during high oxalate meals, minimizing animal protein, and decreasing salt intake. Information about dietary recommendations given today.   RTC 1 year with KUB  Sondra Come, MD  [facility] [address], [address] [place], [place] [address] [phone]

## 2018-03-30 ENCOUNTER — Ambulatory Visit
Admission: RE | Admit: 2018-03-30 | Discharge: 2018-03-30 | Disposition: A | Discharge status: Home / Self Care | Payer: Medicare Other | Source: Ambulatory Visit | Attending: Internal Medicine | Admitting: Internal Medicine

## 2018-03-30 DIAGNOSIS — Z1231 Encounter for screening mammogram for malignant neoplasm of breast: Secondary | ICD-10-CM | POA: Diagnosis present

## 2018-06-10 ENCOUNTER — Encounter
Admission: RE | Admit: 2018-06-10 | Discharge: 2018-06-10 | Disposition: A | Discharge status: Home / Self Care | Payer: Medicare Other | Source: Ambulatory Visit | Attending: Orthopedic Surgery | Admitting: Orthopedic Surgery

## 2018-06-10 ENCOUNTER — Other Ambulatory Visit: Payer: Self-pay

## 2018-06-10 DIAGNOSIS — Z01812 Encounter for preprocedural laboratory examination: Secondary | ICD-10-CM | POA: Diagnosis not present

## 2018-06-10 LAB — BASIC METABOLIC PANEL
ANION GAP: 6 (ref 5–15)
BUN: 16 mg/dL (ref 8–23)
CALCIUM: 9.3 mg/dL (ref 8.9–10.3)
CO2: 28 mmol/L (ref 22–32)
Chloride: 106 mmol/L (ref 98–111)
Creatinine, Ser: 0.93 mg/dL (ref 0.44–1.00)
GFR calc Af Amer: >60 mL/min (ref >60)
GLUCOSE: 156 mg/dL — AB (ref 70–99)
POTASSIUM: 3.9 mmol/L (ref 3.5–5.1)
SODIUM: 140 mmol/L (ref 135–145)

## 2018-06-10 LAB — CBC
HCT: 42.3 % (ref 36.0–46.0)
Hemoglobin: 13.6 g/dL (ref 12.0–15.0)
MCH: 29.4 pg (ref 26.0–34.0)
MCHC: 32.2 g/dL (ref 30.0–36.0)
MCV: 91.6 fL (ref 80.0–100.0)
NRBC: 0 % (ref 0.0–0.2)
PLATELETS: 261 10*3/uL (ref 150–400)
RBC: 4.62 MIL/uL (ref 3.87–5.11)
RDW: 12.8 % (ref 11.5–15.5)
WBC: 4.9 10*3/uL (ref 4.0–10.5)

## 2018-06-10 NOTE — Patient Instructions (Signed)
Your procedure is scheduled on: Thursday 06/17/2018 Report to DAY SURGERY DEPARTMENT LOCATED ON 2ND FLOOR MEDICAL MALL ENTRANCE. To find out your arrival time please call [phone] between 1PM - 3PM on Wednesday 06/16/2018.  Remember: Instructions that are not followed completely may result in serious medical risk, up to and including death, or upon the discretion of your surgeon and anesthesiologist your surgery may need to be rescheduled.     _X__ 1. Do not eat food after midnight the night before your procedure.                 No gum chewing or hard candies. You may drink clear liquids up to 2 hours                 before you are scheduled to arrive for your surgery- DO not drink clear                 liquids within 2 hours of the start of your surgery.                 Clear Liquids include:  water, apple juice without pulp, clear carbohydrate                 drink such as Clearfast or Gatorade, Black Coffee or Tea (Do not add                 anything to coffee or tea).  __X__2.  On the morning of surgery brush your teeth with toothpaste and water, you                 may rinse your mouth with mouthwash if you wish.  Do not swallow any              toothpaste of mouthwash.     _X__ 3.  No Alcohol for 24 hours before or after surgery.   _X__ 4.  Do Not Smoke or use e-cigarettes For 24 Hours Prior to Your Surgery.                 Do not use any chewable tobacco products for at least 6 hours prior to                 surgery.  ____  5.  Bring all medications with you on the day of surgery if instructed.   __X__  6.  Notify your doctor if there is any change in your medical condition      (cold, fever, infections).     Do not wear jewelry, make-up, hairpins, clips or nail polish. Do not wear lotions, powders, or perfumes.  Do not shave 48 hours prior to surgery. Men may shave face and neck. Do not bring valuables to the hospital.    [facility] is not responsible for any belongings  or valuables.  Contacts, dentures/partials or body piercings may not be worn into surgery. Bring a case for your contacts, glasses or hearing aids, a denture cup will be supplied. Leave your suitcase in the car. After surgery it may be brought to your room. For patients admitted to the hospital, discharge time is determined by your treatment team.   Patients discharged the day of surgery will not be allowed to drive home.   Please read over the following fact sheets that you were given:   MRSA Information  __X__ Take these medicines the morning of surgery with A SIP OF WATER:  1. lovastatin (MEVACOR)  2.   3.   4.  5.  6.  ____ Fleet Enema (as directed)   __X__ Use CHG Soap/SAGE wipes as directed  ____ Use inhalers on the day of surgery  __X__ Stop metformin/Janumet/Farxiga 2 days prior to surgery    ____ Take 1/2 of usual insulin dose the night before surgery. No insulin the morning          of surgery.   ____ Stop Blood Thinners Coumadin/Plavix/Xarelto/Pleta/Pradaxa/Eliquis/Effient/Aspirin  on   Or contact your Surgeon, Cardiologist or Medical Doctor regarding  ability to stop your blood thinners  __X__ Stop Anti-inflammatories 7 days before surgery such as Advil, Ibuprofen, Motrin,  BC or Goodies Powder, Naprosyn, Naproxen, Aleve, Aspirin    __X__ Stop all herbal supplements, fish oil or vitamin E until after surgery.    ____ Bring C-Pap to the hospital.

## 2018-06-16 MED ORDER — CEFAZOLIN SODIUM-DEXTROSE 2-4 GM/100ML-% IV SOLN
2.0000 g | Freq: Once | INTRAVENOUS | Status: AC
  Administered 2018-06-17: 2 g via INTRAVENOUS

## 2018-06-17 ENCOUNTER — Ambulatory Visit: Payer: Medicare Other | Admitting: Anesthesiology

## 2018-06-17 ENCOUNTER — Ambulatory Visit
Admission: RE | Admit: 2018-06-17 | Discharge: 2018-06-17 | Disposition: A | Discharge status: Home / Self Care | Payer: Medicare Other | Source: Ambulatory Visit | Attending: Orthopedic Surgery | Admitting: Orthopedic Surgery

## 2018-06-17 ENCOUNTER — Encounter
Admission: RE | Disposition: A | Discharge status: Home / Self Care | Payer: Self-pay | Source: Ambulatory Visit | Attending: Orthopedic Surgery

## 2018-06-17 ENCOUNTER — Other Ambulatory Visit: Payer: Self-pay

## 2018-06-17 DIAGNOSIS — K219 Gastro-esophageal reflux disease without esophagitis: Secondary | ICD-10-CM | POA: Diagnosis not present

## 2018-06-17 DIAGNOSIS — M19041 Primary osteoarthritis, right hand: Secondary | ICD-10-CM | POA: Diagnosis not present

## 2018-06-17 DIAGNOSIS — Z7984 Long term (current) use of oral hypoglycemic drugs: Secondary | ICD-10-CM | POA: Insufficient documentation

## 2018-06-17 DIAGNOSIS — M67441 Ganglion, right hand: Secondary | ICD-10-CM | POA: Insufficient documentation

## 2018-06-17 DIAGNOSIS — E785 Hyperlipidemia, unspecified: Secondary | ICD-10-CM | POA: Insufficient documentation

## 2018-06-17 DIAGNOSIS — Z7982 Long term (current) use of aspirin: Secondary | ICD-10-CM | POA: Insufficient documentation

## 2018-06-17 DIAGNOSIS — E669 Obesity, unspecified: Secondary | ICD-10-CM | POA: Diagnosis not present

## 2018-06-17 DIAGNOSIS — E119 Type 2 diabetes mellitus without complications: Secondary | ICD-10-CM | POA: Insufficient documentation

## 2018-06-17 DIAGNOSIS — Z6831 Body mass index (BMI) 31.0-31.9, adult: Secondary | ICD-10-CM | POA: Diagnosis not present

## 2018-06-17 DIAGNOSIS — Z79899 Other long term (current) drug therapy: Secondary | ICD-10-CM | POA: Diagnosis not present

## 2018-06-17 HISTORY — PX: CYST EXCISION: SHX5701

## 2018-06-17 LAB — GLUCOSE, CAPILLARY
Glucose-Capillary: 96 mg/dL (ref 70–99)
Glucose-Capillary: 61 mg/dL — ABNORMAL LOW (ref 70–99)
Glucose-Capillary: 109 mg/dL — ABNORMAL HIGH (ref 70–99)

## 2018-06-17 SURGERY — CYST REMOVAL
Anesthesia: General | Site: Hand | Laterality: Right

## 2018-06-17 MED ORDER — LIDOCAINE HCL (PF) 2 % IJ SOLN
INTRAMUSCULAR | Status: AC
  Filled 2018-06-17: qty 10

## 2018-06-17 MED ORDER — DEXAMETHASONE SODIUM PHOSPHATE 10 MG/ML IJ SOLN
INTRAMUSCULAR | Status: DC | PRN
  Administered 2018-06-17: 5 mg via INTRAVENOUS

## 2018-06-17 MED ORDER — FENTANYL CITRATE (PF) 100 MCG/2ML IJ SOLN
INTRAMUSCULAR | Status: AC
  Filled 2018-06-17: qty 2

## 2018-06-17 MED ORDER — CEFAZOLIN SODIUM-DEXTROSE 2-4 GM/100ML-% IV SOLN
INTRAVENOUS | Status: AC
  Filled 2018-06-17: qty 100

## 2018-06-17 MED ORDER — ONDANSETRON HCL 4 MG/2ML IJ SOLN
INTRAMUSCULAR | Status: DC | PRN
  Administered 2018-06-17: 4 mg via INTRAVENOUS

## 2018-06-17 MED ORDER — PROPOFOL 10 MG/ML IV BOLUS
INTRAVENOUS | Status: AC
  Filled 2018-06-17: qty 20

## 2018-06-17 MED ORDER — FENTANYL CITRATE (PF) 100 MCG/2ML IJ SOLN
INTRAMUSCULAR | Status: DC | PRN
  Administered 2018-06-17: 25 ug via INTRAVENOUS
  Administered 2018-06-17: 50 ug via INTRAVENOUS

## 2018-06-17 MED ORDER — FAMOTIDINE 20 MG PO TABS
ORAL_TABLET | ORAL | Status: AC
  Administered 2018-06-17: 20 mg via ORAL
  Filled 2018-06-17: qty 1

## 2018-06-17 MED ORDER — SCOPOLAMINE 1 MG/3DAYS TD PT72
MEDICATED_PATCH | TRANSDERMAL | Status: AC
  Filled 2018-06-17: qty 1

## 2018-06-17 MED ORDER — DEXAMETHASONE SODIUM PHOSPHATE 10 MG/ML IJ SOLN
INTRAMUSCULAR | Status: AC
  Filled 2018-06-17: qty 1

## 2018-06-17 MED ORDER — BUPIVACAINE HCL (PF) 0.5 % IJ SOLN
INTRAMUSCULAR | Status: DC | PRN
  Administered 2018-06-17: 10 mL

## 2018-06-17 MED ORDER — LIDOCAINE HCL (PF) 1 % IJ SOLN
INTRAMUSCULAR | Status: AC
  Filled 2018-06-17: qty 30

## 2018-06-17 MED ORDER — PHENYLEPHRINE HCL 10 MG/ML IJ SOLN
INTRAMUSCULAR | Status: DC | PRN
  Administered 2018-06-17: 100 ug via INTRAVENOUS

## 2018-06-17 MED ORDER — DEXTROSE 50 % IV SOLN
INTRAVENOUS | Status: AC
  Filled 2018-06-17: qty 50

## 2018-06-17 MED ORDER — DEXTROSE 50 % IV SOLN
25.0000 mL | Freq: Once | INTRAVENOUS | Status: AC
  Administered 2018-06-17: 25 mL via INTRAVENOUS

## 2018-06-17 MED ORDER — ACETAMINOPHEN 10 MG/ML IV SOLN
INTRAVENOUS | Status: DC | PRN
  Administered 2018-06-17: 1000 mg via INTRAVENOUS

## 2018-06-17 MED ORDER — FAMOTIDINE 20 MG PO TABS
20.0000 mg | ORAL_TABLET | Freq: Once | ORAL | Status: AC
  Administered 2018-06-17: 20 mg via ORAL

## 2018-06-17 MED ORDER — SCOPOLAMINE 1 MG/3DAYS TD PT72
1.0000 | MEDICATED_PATCH | Freq: Once | TRANSDERMAL | Status: DC
  Administered 2018-06-17: 1.5 mg via TRANSDERMAL

## 2018-06-17 MED ORDER — HYDROMORPHONE HCL 1 MG/ML IJ SOLN: 0.2500 mg | INTRAMUSCULAR | Status: DC | PRN

## 2018-06-17 MED ORDER — PROPOFOL 10 MG/ML IV BOLUS
INTRAVENOUS | Status: DC | PRN
  Administered 2018-06-17: 150 mg via INTRAVENOUS

## 2018-06-17 MED ORDER — ACETAMINOPHEN 10 MG/ML IV SOLN
INTRAVENOUS | Status: AC
  Filled 2018-06-17: qty 100

## 2018-06-17 MED ORDER — PROPOFOL 500 MG/50ML IV EMUL
INTRAVENOUS | Status: DC | PRN
  Administered 2018-06-17: 25 ug/kg/min via INTRAVENOUS

## 2018-06-17 MED ORDER — LIDOCAINE HCL (CARDIAC) PF 100 MG/5ML IV SOSY
PREFILLED_SYRINGE | INTRAVENOUS | Status: DC | PRN
  Administered 2018-06-17: 60 mg via INTRAVENOUS

## 2018-06-17 MED ORDER — SODIUM CHLORIDE 0.9 % IV SOLN
INTRAVENOUS | Status: DC
  Administered 2018-06-17 (×2): via INTRAVENOUS

## 2018-06-17 MED ORDER — BUPIVACAINE HCL (PF) 0.5 % IJ SOLN
INTRAMUSCULAR | Status: AC
  Filled 2018-06-17: qty 30

## 2018-06-17 MED ORDER — ONDANSETRON HCL 4 MG/2ML IJ SOLN
INTRAMUSCULAR | Status: AC
  Filled 2018-06-17: qty 2

## 2018-06-17 SURGICAL SUPPLY — 26 items
BIT DRILL 24 ACUTRAK FUSION (BIT) ×3 IMPLANT
BNDG CONFORM 2 STRL LF (GAUZE/BANDAGES/DRESSINGS) ×3 IMPLANT
CAST PADDING 2X4YD ST 30245 (MISCELLANEOUS) ×2
CHLORAPREP W/TINT 26ML (MISCELLANEOUS) ×3 IMPLANT
COVER WAND RF STERILE (DRAPES) ×3 IMPLANT
CUFF TOURN 24 STER (MISCELLANEOUS) ×3 IMPLANT
GAUZE PETRO XEROFOAM 1X8 (MISCELLANEOUS) ×3 IMPLANT
GAUZE XEROFORM 4X4 STRL (GAUZE/BANDAGES/DRESSINGS) ×3 IMPLANT
GLOVE SURG SYN 9.0  PF PI (GLOVE) ×2
GLOVE SURG SYN 9.0 PF PI (GLOVE) ×1 IMPLANT
GOWN SRG 2XL LVL 4 RGLN SLV (GOWNS) ×1 IMPLANT
GOWN STRL NON-REIN 2XL LVL4 (GOWNS) ×2
GOWN STRL REUS W/ TWL LRG LVL3 (GOWN DISPOSABLE) ×1 IMPLANT
GOWN STRL REUS W/TWL LRG LVL3 (GOWN DISPOSABLE) ×2
GUIDEWIRE ORTHO 062 (WIRE) ×3 IMPLANT
KIT TURNOVER KIT A (KITS) ×3 IMPLANT
NEEDLE HYPO 25X1 1.5 SAFETY (NEEDLE) ×3 IMPLANT
NS IRRIG 500ML POUR BTL (IV SOLUTION) ×3 IMPLANT
PACK EXTREMITY ARMC (MISCELLANEOUS) ×3 IMPLANT
PADDING CAST COTTON 2X4 ST (MISCELLANEOUS) ×1 IMPLANT
SCALPEL PROTECTED #15 DISP (BLADE) ×6 IMPLANT
SCREW FUSION ACUTRAK 30 3000 (Screw) ×1 IMPLANT
SCREW FUSION ACUTRAK 30MM 3000 (Screw) ×3 IMPLANT
SPONGE GAUZE 2X2 8PLY STER LF (GAUZE/BANDAGES/DRESSINGS) ×1
SPONGE GAUZE 2X2 8PLY STRL LF (GAUZE/BANDAGES/DRESSINGS) ×2 IMPLANT
SUT ETHILON 4 0 P 3 18 (SUTURE) ×3 IMPLANT

## 2018-06-17 NOTE — H&P (Signed)
Reviewed paper H+P, will be scanned into chart. No changes noted.  

## 2018-06-17 NOTE — Discharge Instructions (Addendum)
Keep arm elevated as much as possible.  Do not try to bend the finger.  Ice to the back of the hand may help as well with pain.  Pain medicine as directed.   AMBULATORY SURGERY  DISCHARGE INSTRUCTIONS   1) The drugs that you were given will stay in your system until tomorrow so for the next 24 hours you should not:  A) Drive an automobile B) Make any legal decisions C) Drink any alcoholic beverage   2) You may resume regular meals tomorrow.  Today it is better to start with liquids and gradually work up to solid foods.  You may eat anything you prefer, but it is better to start with liquids, then soup and crackers, and gradually work up to solid foods.   3) Please notify your doctor immediately if you have any unusual bleeding, trouble breathing, redness and pain at the surgery site, drainage, fever, or pain not relieved by medication.    4) Additional Instructions:        Please contact your physician with any problems or Same Day Surgery at [phone], Monday through Friday 6 am to 4 pm, or [place] [facility] Main number at [phone].

## 2018-06-17 NOTE — Transfer of Care (Signed)
Immediate Anesthesia Transfer of Care Note  Patient: Courtney [name]  Procedure(s) Performed: RIGHT MIDDLE FINGER DIP FUSION, EXCISION OF MUCOUS CYST (Right Hand)  Patient Location: PACU  Anesthesia Type:General  Level of Consciousness: sedated  Airway & Oxygen Therapy: Patient Spontanous Breathing and Patient connected to face mask oxygen  Post-op Assessment: Report given to RN and Post -op Vital signs reviewed and stable  Post vital signs: Reviewed and stable  Last Vitals:  Vitals Value Taken Time  BP 152/85 06/17/2018  5:51 PM  Temp 36.7 C 06/17/2018  5:51 PM  Pulse 74 06/17/2018  5:53 PM  Resp 15 06/17/2018  5:53 PM  SpO2 100 % 06/17/2018  5:53 PM  Vitals shown include unvalidated device data.  Last Pain:  Vitals:   06/17/18 1328  TempSrc: Oral  PainSc: 0-No pain         Complications: No apparent anesthesia complications

## 2018-06-17 NOTE — Anesthesia Procedure Notes (Signed)
Procedure Name: LMA Insertion Date/Time: 06/17/2018 5:00 PM Performed by: Lily Kocher, CRNA Pre-anesthesia Checklist: Patient identified, Patient being monitored, Timeout performed, Emergency Drugs available and Suction available Patient Re-evaluated:Patient Re-evaluated prior to induction Oxygen Delivery Method: Circle system utilized Preoxygenation: Pre-oxygenation with 100% oxygen Induction Type: IV induction Ventilation: Mask ventilation without difficulty LMA: LMA inserted LMA Size: 4.0 Tube type: Oral Number of attempts: 1 Placement Confirmation: positive ETCO2 and breath sounds checked- equal and bilateral Tube secured with: Tape Dental Injury: Teeth and Oropharynx as per pre-operative assessment

## 2018-06-17 NOTE — Anesthesia Preprocedure Evaluation (Addendum)
Anesthesia Evaluation  Patient identified by MRN, date of birth, ID band Patient awake    Reviewed: Allergy & Precautions, H&P , NPO status , Patient's Chart, lab work & pertinent test results  History of Anesthesia Complications (+) PONV and history of anesthetic complications  Airway Mallampati: III       Dental  (+) Chipped   Pulmonary neg pulmonary ROS,           Cardiovascular hypertension,      Neuro/Psych negative neurological ROS  negative psych ROS   GI/Hepatic Neg liver ROS, GERD  Controlled,  Endo/Other  diabetes  Renal/GU      Musculoskeletal   Abdominal   Peds  Hematology negative hematology ROS (+)   Anesthesia Other Findings obese  Past Medical History: No date: Arthritis No date: Complication of anesthesia No date: Diabetes mellitus without complication (HCC) No date: GERD (gastroesophageal reflux disease) No date: History of kidney stones No date: Hyperlipemia No date: Hypertension No date: Obesity No date: PONV (postoperative nausea and vomiting)  Past Surgical History: No date: ABDOMINAL HYSTERECTOMY 2018: CATARACT EXTRACTION W/ INTRAOCULAR LENS IMPLANT; Left No date: COLONOSCOPY 01/08/2018: CYSTOSCOPY WITH STENT PLACEMENT; Right     Comment:  Procedure: CYSTOSCOPY WITH STENT PLACEMENT;  Surgeon:               Crist Fat, MD;  Location: [facility] ORS;  Service:               Urology;  Laterality: Right; 01/29/2018: CYSTOSCOPY/URETEROSCOPY/HOLMIUM LASER/STENT PLACEMENT;  Right     Comment:  Procedure: CYSTOSCOPY/URETEROSCOPY/HOLMIUM LASER/STENT               Exchange;  Surgeon: Sondra Come, MD;  Location: [facility]              ORS;  Service: Urology;  Laterality: Right; No date: EYE SURGERY No date: HEMORRHOID SURGERY 06/25/2015: KNEE ARTHROSCOPY; Right     Comment:  Procedure: ARTHROSCOPY KNEE, PARTIAL MEDIAL MENISECTOMY,              PARTIAL LATERAL MENISECTOMY, CHONDROPLASTY  MEDIAL               PATELLAR FEMORAL;  Surgeon: Donato Heinz, MD;                Location: [facility] ORS;  Service: Orthopedics;  Laterality:               Right;  BMI    Body Mass Index:  31.12 kg/m      Reproductive/Obstetrics negative OB ROS                            Anesthesia Physical Anesthesia Plan  ASA: III  Anesthesia Plan: General LMA   Post-op Pain Management:    Induction:   PONV Risk Score and Plan: Dexamethasone, Ondansetron, Midazolam and Treatment may vary due to age or medical condition  Airway Management Planned:   Additional Equipment:   Intra-op Plan:   Post-operative Plan:   Informed Consent: I have reviewed the patients History and Physical, chart, labs and discussed the procedure including the risks, benefits and alternatives for the proposed anesthesia with the patient or authorized representative who has indicated his/her understanding and acceptance.     Dental Advisory Given  Plan Discussed with: Anesthesiologist and CRNA  Anesthesia Plan Comments:         Anesthesia Quick Evaluation

## 2018-06-17 NOTE — Op Note (Signed)
06/17/2018  5:47 PM  PATIENT:  Courtney [name]  73 y.o. female  PRE-OPERATIVE DIAGNOSIS:  MUCOUS CYST OF DIGIT OF RIGHT HAND middle finger  POST-OPERATIVE DIAGNOSIS:  MUCOUS CYST OF DIGIT OF RIGHT HAND middle finger  PROCEDURE:  Procedure(s): RIGHT MIDDLE FINGER DIP FUSION, EXCISION OF MUCOUS CYST (Right)  SURGEON: Leitha Schuller, MD  ANESTHESIA:   general  EBL:  Total I/O In: 800 [I.V.:800] Out: -   BLOOD ADMINISTERED:none  DRAINS: none   LOCAL MEDICATIONS USED:  MARCAINE     SPECIMEN:  No Specimen  DISPOSITION OF SPECIMEN:  N/A  COUNTS:  YES  TOURNIQUET:  * Missing tourniquet times found for documented tourniquets in log: [id number] *  IMPLANTS: Acumed DIP fusion screw 30 mm  DICTATION: .Dragon Dictation   Patient brought to the operating room and after adequate anesthesia was obtained the right arm was prepped and draped in sterile fashion was turned by the upper arm. After patient identification and timeout procedure were completed, tourniquet was raised to 250 mmHg. .  A T-shaped incision was made over the DIP joint and the extensor tendon incised dorsal spur was debrided. The sclerotic ends of the IP joint were debrided to get bleeding bone. K wires then inserted down the middle phalanx and then out through the distal phalanx and then back into the middle phalanx alignment appeared anatomic with slight flexion. Measurements were made off the K wire and a 30 mm screw was chosen. The K wires removed and drilling was carried out with a hand drill. The 30 mm screw was inserted to the appropriate depth, with the head buried in the distal phalanx. On mini C-arm views, there was anatomic alignment with good compression of the site of the fusion.  The large cyst was on the ulnar side of the joint and after debriding the joint the cyst was evacuated and removed.  The wound was then closed with 5-0 nylon. At the start close of the case 10 cc was infiltrated as a digital block for  postoperative analgesia. There are no complications no specimen. Wound was dressed with Xeroform 2 x 2's and a 1 inch, Conform dressing  PLAN OF CARE: Discharge to home after PACU

## 2018-06-17 NOTE — Anesthesia Post-op Follow-up Note (Signed)
Anesthesia QCDR form completed.        

## 2018-06-18 NOTE — Anesthesia Postprocedure Evaluation (Signed)
Anesthesia Post Note  Patient: Courtney [name]  Procedure(s) Performed: RIGHT MIDDLE FINGER DIP FUSION, EXCISION OF MUCOUS CYST (Right Hand)  Patient location during evaluation: PACU Anesthesia Type: General Level of consciousness: awake and alert Pain management: pain level controlled Vital Signs Assessment: post-procedure vital signs reviewed and stable Respiratory status: spontaneous breathing, nonlabored ventilation and respiratory function stable Cardiovascular status: blood pressure returned to baseline and stable Postop Assessment: no apparent nausea or vomiting Anesthetic complications: no     Last Vitals:  Vitals:   06/17/18 1848 06/17/18 1851  BP: (!) 167/71 (!) 167/71  Pulse: (!) 58 (!) 59  Resp: 16 16  Temp:    SpO2: 99% 99%    Last Pain:  Vitals:   06/17/18 1848  TempSrc:   PainSc: 0-No pain                 Jovita Gamma

## 2019-02-01 ENCOUNTER — Other Ambulatory Visit: Payer: Self-pay | Admitting: Internal Medicine

## 2019-02-01 DIAGNOSIS — Z1231 Encounter for screening mammogram for malignant neoplasm of breast: Secondary | ICD-10-CM

## 2019-03-07 ENCOUNTER — Ambulatory Visit: Payer: Medicare Other | Admitting: Urology

## 2019-04-01 ENCOUNTER — Ambulatory Visit
Admission: RE | Admit: 2019-04-01 | Discharge: 2019-04-01 | Disposition: A | Discharge status: Home / Self Care | Payer: Medicare Other | Source: Ambulatory Visit | Attending: Internal Medicine | Admitting: Internal Medicine

## 2019-04-01 DIAGNOSIS — Z1231 Encounter for screening mammogram for malignant neoplasm of breast: Secondary | ICD-10-CM | POA: Diagnosis not present

## 2020-02-23 ENCOUNTER — Other Ambulatory Visit: Payer: Self-pay | Admitting: Internal Medicine

## 2020-02-23 DIAGNOSIS — Z1231 Encounter for screening mammogram for malignant neoplasm of breast: Secondary | ICD-10-CM

## 2020-04-02 ENCOUNTER — Ambulatory Visit
Admission: RE | Admit: 2020-04-02 | Discharge: 2020-04-02 | Disposition: A | Discharge status: Home / Self Care | Payer: Medicare Other | Source: Ambulatory Visit | Attending: Internal Medicine | Admitting: Internal Medicine

## 2020-04-02 ENCOUNTER — Other Ambulatory Visit: Payer: Self-pay

## 2020-04-02 DIAGNOSIS — Z1231 Encounter for screening mammogram for malignant neoplasm of breast: Secondary | ICD-10-CM | POA: Insufficient documentation

## 2021-02-15 ENCOUNTER — Other Ambulatory Visit: Payer: Self-pay | Admitting: Internal Medicine

## 2021-02-15 DIAGNOSIS — Z1231 Encounter for screening mammogram for malignant neoplasm of breast: Secondary | ICD-10-CM

## 2021-04-03 ENCOUNTER — Other Ambulatory Visit: Payer: Self-pay

## 2021-04-03 ENCOUNTER — Ambulatory Visit
Admission: RE | Admit: 2021-04-03 | Discharge: 2021-04-03 | Disposition: A | Discharge status: Home / Self Care | Payer: Medicare Other | Source: Ambulatory Visit | Attending: Internal Medicine | Admitting: Internal Medicine

## 2021-04-03 DIAGNOSIS — Z1231 Encounter for screening mammogram for malignant neoplasm of breast: Secondary | ICD-10-CM | POA: Insufficient documentation

## 2022-03-17 ENCOUNTER — Other Ambulatory Visit: Payer: Self-pay | Admitting: Internal Medicine

## 2022-03-17 DIAGNOSIS — Z1231 Encounter for screening mammogram for malignant neoplasm of breast: Secondary | ICD-10-CM

## 2022-04-04 ENCOUNTER — Ambulatory Visit
Admission: RE | Admit: 2022-04-04 | Discharge: 2022-04-04 | Disposition: A | Discharge status: Home / Self Care | Payer: Medicare Other | Source: Ambulatory Visit | Attending: Internal Medicine | Admitting: Internal Medicine

## 2022-04-04 DIAGNOSIS — Z1231 Encounter for screening mammogram for malignant neoplasm of breast: Secondary | ICD-10-CM | POA: Diagnosis present

## 2023-03-02 ENCOUNTER — Other Ambulatory Visit: Payer: Self-pay | Admitting: Internal Medicine

## 2023-03-02 DIAGNOSIS — Z1231 Encounter for screening mammogram for malignant neoplasm of breast: Secondary | ICD-10-CM

## 2023-04-06 ENCOUNTER — Ambulatory Visit
Admission: RE | Admit: 2023-04-06 | Discharge: 2023-04-06 | Disposition: A | Discharge status: Home / Self Care | Payer: Medicare Other | Source: Ambulatory Visit | Attending: Internal Medicine | Admitting: Internal Medicine

## 2023-04-06 DIAGNOSIS — Z1231 Encounter for screening mammogram for malignant neoplasm of breast: Secondary | ICD-10-CM | POA: Insufficient documentation

## 2024-02-25 DIAGNOSIS — Z23 Encounter for immunization: Secondary | ICD-10-CM | POA: Diagnosis not present

## 2024-02-29 ENCOUNTER — Other Ambulatory Visit: Payer: Self-pay | Admitting: Internal Medicine

## 2024-02-29 DIAGNOSIS — Z1231 Encounter for screening mammogram for malignant neoplasm of breast: Secondary | ICD-10-CM

## 2024-04-06 ENCOUNTER — Ambulatory Visit
Admission: RE | Admit: 2024-04-06 | Discharge: 2024-04-06 | Disposition: A | Discharge status: Home / Self Care | Source: Ambulatory Visit | Attending: Internal Medicine | Admitting: Internal Medicine

## 2024-04-06 DIAGNOSIS — Z1231 Encounter for screening mammogram for malignant neoplasm of breast: Secondary | ICD-10-CM | POA: Diagnosis present
